# Patient Record
Sex: Male | Born: 1945 | Race: White | Smoking: Former smoker
Health system: Northeastern US, Academic
[De-identification: ages and names within clinical notes are randomized; demographics above are authoritative.]

## PROBLEM LIST (undated history)

## (undated) DIAGNOSIS — A6920 Lyme disease, unspecified: Secondary | ICD-10-CM

## (undated) DIAGNOSIS — R51 Headache: Secondary | ICD-10-CM

## (undated) HISTORY — DX: Headache: R51

## (undated) HISTORY — DX: Lyme disease, unspecified: A69.20

---

## 2008-05-30 DIAGNOSIS — I10 Essential (primary) hypertension: Secondary | ICD-10-CM | POA: Insufficient documentation

## 2008-05-30 DIAGNOSIS — E785 Hyperlipidemia, unspecified: Secondary | ICD-10-CM | POA: Insufficient documentation

## 2009-01-04 DIAGNOSIS — K6289 Other specified diseases of anus and rectum: Secondary | ICD-10-CM | POA: Insufficient documentation

## 2009-01-04 DIAGNOSIS — D126 Benign neoplasm of colon, unspecified: Secondary | ICD-10-CM | POA: Insufficient documentation

## 2010-07-30 ENCOUNTER — Encounter: Payer: Self-pay | Admitting: Gastroenterology

## 2010-08-21 ENCOUNTER — Ambulatory Visit
Admit: 2010-08-21 | Discharge: 2010-08-21 | Disposition: A | Discharge status: Home / Self Care | Payer: Self-pay | Source: Ambulatory Visit | Attending: Family Medicine | Admitting: Family Medicine

## 2010-08-21 LAB — ALT: ALT: 31 U/L (ref 0–50)

## 2010-08-21 LAB — AST: AST: 28 U/L (ref 0–50)

## 2010-08-21 LAB — CBC
Hematocrit: 46 % (ref 40–51)
Hemoglobin: 15.6 g/dL (ref 13.7–17.5)
MCV: 95 fL — ABNORMAL HIGH (ref 79–92)
Platelets: 191 THOU/uL (ref 150–330)
RBC: 4.9 MIL/uL (ref 4.6–6.1)
RDW: 13 % (ref 11.6–14.4)
WBC: 5.1 THOU/uL (ref 4.2–9.1)

## 2010-08-21 LAB — LIPID PANEL
Chol/HDL Ratio: 1.9
Cholesterol: 143 mg/dL
HDL: 74 mg/dL
LDL Calculated: 56 mg/dL
Non HDL Cholesterol: 69 mg/dL
Triglycerides: 65 mg/dL

## 2010-08-21 LAB — PSA (EFF.4-2010): PSA (eff. 4-2010): 0.74 ng/mL (ref 0.00–4.00)

## 2010-08-21 LAB — GLUCOSE: Glucose: 82 mg/dL (ref 74–106)

## 2010-08-21 LAB — TESTOSTERONE BY IMMUNOASSAY (ADULT MALES OR INDIVIDUALS ON TESTOSTERONE HORMONE THERAPY): Testosterone: 492 ng/dL (ref 193–740)

## 2010-09-20 ENCOUNTER — Ambulatory Visit: Payer: Self-pay | Admitting: Family Medicine

## 2010-09-21 NOTE — Progress Notes (Signed)
 Reason For Visit   Follow up  [facility] LPN.  HPI   Presents for evaluation, wishing to review status of his health.     His head difficulties in the recent past, erectile failure having,   frustration.     Having been married 3 times, first ending in divorce, his second wife dying   in a house fire, his most recent marriage also has ended in divorce,   patient now having a girlfriend for the past several months.  He has had   prior HIV testing, feeling that there is no significant risk of his having   troubles at this point.     He denies any difficulties with libido however acknowledges feelings of   avoidance due to fear of failure.     He has previously used Levitra, taking half of a 20 mg tablet which has   been helpful, 20 mg poorly tolerated with extreme rhinitis, having some   degree of headache with the 10 mg dose.  To some extent, this is   reminiscent of the headache that he was experiencing when concerns about   Lyme disease had been very active 10 years ago, severity of current   headache however much less (3-4/10 due to medication, 8/10 due to Lyme   problems).     He has had no other symptoms that would suggest that Lyme disease isn't an   active problem however his head is extremely sensitive, inadvertently   bumping his head be much more painful at this point than it was prior to   the Lyme diagnosis.     He does have doxycycline, using that at the earliest sign of infection,   finding that that has been helpful for him, having been on chronic   antibiotic therapy for a number of years in the past.     He has had recurrent difficulties with cold sores, but having been an area   of discussion with his girlfriend, but thus far has not become a major   concern.     He denies any chest pain, shortness of breath, neurologic symptoms,   difficulties with urinary tract, his last digital rectal exam having been   almost 2 years ago at the time of his physical in February 2010.  Allergies   No Known Drug  Allergy.  Current Meds   ** Medication reconciliation completed. **.  Doxycycline Monohydrate 100 MG Tablet;TAKE 1 TABLET EVERY 12 HOURS DAILY.;   Rx  Levitra 20 MG Tablet;TAKE AS DIRECTED.; Rx  Valacyclovir HCl 1 GM Tablet;TAKE 2 TABLETS TWICE DAILY FOR 1 DAY AT FIRST   SIGN OF ONSET.; Rx  Levitra 10 MG Tablet;TAKE AS DIRECTED.; Rx  Cetirizine HCl 10 MG Tablet;TAKE 1 TABLET DAILY AS NEEDED.; RPT.  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   Charna Elizabeth  Hyperlipidemia (272.4)  Hypertension (401.9).  Vital Signs   Recorded by [id number] on 20 Sep 2010 01:29 PM  BP:136/84,   Weight: 208 lb.  Physical Exam   No acute distress.  Blood pressure at the upper end of normal.  Weight appears stable.  Establishes excellent eye contact, no speech or thought disturbance, mood   appropriate.  Neck:supple,without adenopathy,mass,tenderness, or deformity; carotids   normal, without bruits,no thyromegaly.  Lungs, normal to percussion and auscultation, no wheeze, rales, or rhonchi,   no accessory muscle use.  Heart, regular, normal rate, without murmurs, gallops, or rubs.  Genitalia: penis normal,testicles without mass or tenderness,scrotum   without mass, redness, or increased warmth.  Rectal exam, no evidence of mass, fissure or hemorrhoid, prostate normal   size and texture.  Stool is brown, heme-negative with QC.  Lower Extremities, without clubbing, cyanosis or edema.  Peripheral pulses   intact with good capillary refill.     .  Assessment   Erectile dysfunction, patient having adverse effects related to his use of   Levitra, 20 mg causing severe rhinitis and headache, 10 mg still associated   with headache.     Discussion relative to HIV risk, patient feeling confident that she does   not need to do any screening albeit current relationship being unprotected.     Headache, appearing to be related to medication, having a significant   history of Lyme disease related  headache in the past.     Lyme disease, currently appearing to be in an active issue, presentation   issues as noted above.     Recurrent cold sores, patient requesting prescription for antiviral.  It is   not clear to him how frequently these are actually occurring at this time,   preferring to use as needed dosing rather than maintenance dosing at this   time.  Health Mgmt Plan   HIV Testing-accepted every 60 years; for HEALTH MAINTENANCE.  HIV Testing-accepted every 60 years; for HEALTH MAINTENANCE.  HIV Testing-declined every 10 years; for HEALTH MAINTENANCE.  Orders   Valacyclovir HCl 1 GM Tablet;TAKE 2 TABLETS TWICE DAILY FOR 1 DAY AT FIRST   SIGN OF ONSET; Qty8; R3; Rx.  Levitra 10 MG Tablet;TAKE AS DIRECTED; Qty6; R5; Rx.  Plan   Discuss ADRs is advised to Levitra, suggesting a 10 mg tablet, half tablet   daily may drop in the low headache threshold while supervising clinical   benefit.  If not, then will return to the 20 mg dose.     HIV risk issues are reviewed, patient declining testing at this point.     Reassure ALT of the headaches.  He potential that some of his headache   concerns may be remotely related to his prior history of Lyme disease,   having had severe headaches for a number of years, prior physician having   suggested meningeal involvement as a source.  If this were the case, it is   quite likely that there is some mild residual scarring making his   sensitivity to contusion much greater.  The headache associated with   Levitra however sounds much different and is likely vascular rather than   mechanical.     Suggest use of valacyclovir, 2 g twice daily for day at the onset of cold   sore.  He is reminded of the potential for viral shedding, suggesting that   if there is any significant frequency of cold sores, not only can these   flares be prevented but also a significant reduction in viral shedding   while asymptomatic can be accomplished.     Advised that he plan on seeing me in a year  for a physical exam, reassuring   him that his current exam including digital rectal appears to be benign.    He had a tubular adenoma at the time of his colonoscopy in April 2010, he   should have followup study in 3-5 years, seeking advice from Dr. Odella Aquas   being advised     30 minute face-to-face encounter with patient, greater than 50% of which  is   spent counseling and educating relative to management issues.  Signature   Electronically signed by: Remonia Richter  M.D.; 09/21/2010 1:17 PM EST.

## 2010-10-14 NOTE — Miscellaneous (Unsigned)
 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 ; RPT   Doxycycline Monohydrate 100 MG Tablet; TAKE 1 TABLET EVERY 12 HOURS DAILY.   ; Rx   Levitra 10 MG Tablet; TAKE AS DIRECTED. ; Rx   Valacyclovir HCl 1 GM Tablet; TAKE 2 TABLETS TWICE DAILY FOR 1 DAY AT FIRST   SIGN OF ONSET. ; Rx   Valacyclovir HCl 500 MG Tablet; TAKE 1 TABLET DAILY. ; Rx   Viagra 50 MG Tablet; 1/2 - 2 tabs as directed ; Rx     Immunizations  Tdap (Adacel)

## 2011-04-02 ENCOUNTER — Encounter: Payer: Self-pay | Admitting: Family Medicine

## 2011-04-02 ENCOUNTER — Telehealth: Payer: Self-pay | Admitting: Family Medicine

## 2011-04-02 ENCOUNTER — Ambulatory Visit: Payer: Self-pay | Admitting: Family Medicine

## 2011-04-02 VITALS — BP 138/82 | HR 59 | Temp 98.3°F | Resp 17 | Wt 215.0 lb

## 2011-04-02 DIAGNOSIS — R3129 Other microscopic hematuria: Secondary | ICD-10-CM

## 2011-04-02 DIAGNOSIS — M549 Dorsalgia, unspecified: Secondary | ICD-10-CM

## 2011-04-02 LAB — POCT URINALYSIS DIPSTICK
Glucose,UA: NORMAL
Ketones, UA: NEGATIVE
Leuk Esterase,UA: NEGATIVE
Lot #: 21146903
Nitrite,UA POCT: NEGATIVE
PH,Ur: 5

## 2011-04-02 NOTE — Progress Notes (Signed)
SUBJECTIVE:Wishing expressing concerns about back pain.    States that he had blood work done 6 weeks ago from the [place], having not heard anything of concern however at that time did have some lower back pain, the provider that he saw telling him that it was probably from his kidneys but pursued no further.  Over the past 6 weeks, this has become persistent, day and night, recently becoming more intense.    Denies any injury.  Denies any hematuria, frequency, urgency.  Denies any cough, dyspnea.  Does have significant smoking history as noted above.  He had URI symptoms 2-3 weeks ago, transient chest cold but those seem to have resolved, pain unaffected by illness and clearly not resolving as listed.    Pain increases with twisting, bending, or putting pressure on his back.  Denies any radicular symptoms.  Appetite has been good however he has become progressively worried about etiology of this pain, not feeling that persistent unexplained pain may be related to cancer.     OBJECTIVE:No acute distress.  Interacts well.  Apprehension evident, concerns as noted above.  Blood pressure excellent.  Afebrile.  O2 saturation excellent.  Respiratory rate is not increased.  He has marked left CVA tenderness, palpation or percussion.  Pain is aggravated by lateral bending or rotation to the left, lateral bending to the right generally well-tolerated.  He can bend over nearly to touch toes, discomfort at extremes of flexion or upon completely achieving a full standing position.    Urinalysis as noted, microscopic protein and hematuria     ASSESSMENT:Left back pain, certainly the most concerning issue would be kidney stone with progressive hydronephrosis.  In the absence of symptoms associated with voiding, genitourinary etiology however may not be present.     PLAN:Arrange for urgent ultrasound of kidneys.  Will make additional plans based on results.    ADDENDUM:  Ultrasound demonstrates no evidence of hydronephrosis, further  showing normal bladder, no evidence of kidney stones, additionally noting That his aorta appears to be quite healthy, lower bifurcation not well seen however with pain virtually subcostal, distal arterial disease would be an unlikely source of symptoms.  This is reviewed with patient.  Advise that we check chest x-ray, additionally checking blood work, comprehensive profile, amylase, lipase, CBC with differential, and sedimentation rate.  Patient will get blood work and x-ray done tomorrow morning.  The above studies added to today's encounter orders.

## 2011-04-02 NOTE — Telephone Encounter (Signed)
RENAL ULTRA SOUND IS NORMAL

## 2011-04-03 ENCOUNTER — Ambulatory Visit
Admit: 2011-04-03 | Discharge: 2011-04-03 | Disposition: A | Discharge status: Home / Self Care | Payer: Self-pay | Source: Ambulatory Visit | Attending: Family Medicine | Admitting: Family Medicine

## 2011-04-03 DIAGNOSIS — M549 Dorsalgia, unspecified: Secondary | ICD-10-CM

## 2011-04-03 DIAGNOSIS — R3129 Other microscopic hematuria: Secondary | ICD-10-CM

## 2011-04-03 LAB — COMPREHENSIVE METABOLIC PANEL
ALT: 44 U/L (ref 0–50)
AST: 28 U/L (ref 0–50)
Albumin: 4.5 g/dL (ref 3.5–5.2)
Alk Phos: 71 U/L (ref 40–130)
Anion Gap: 11 (ref 7–16)
Bilirubin,Total: 0.7 mg/dL (ref 0.0–1.2)
CO2: 28 mmol/L (ref 20–28)
Calcium: 8.7 mg/dL (ref 8.6–10.2)
Chloride: 103 mmol/L (ref 96–108)
Creatinine: 1.02 mg/dL (ref 0.67–1.17)
GFR,Black: >59 *
GFR,Caucasian: >59 *
Glucose: 77 mg/dL (ref 60–99)
Lab: 13 mg/dL (ref 6–20)
Potassium: 4 mmol/L (ref 3.3–5.1)
Sodium: 142 mmol/L (ref 133–145)
Total Protein: 6.8 g/dL (ref 6.3–7.7)

## 2011-04-03 LAB — AMYLASE: Amylase: 80 U/L (ref 28–100)

## 2011-04-03 LAB — CBC AND DIFFERENTIAL
Baso # K/uL: 0 10*3/uL (ref 0.0–0.1)
Basophil %: 0.5 % (ref 0.2–1.2)
Eos # K/uL: 0.2 10*3/uL (ref 0.0–0.5)
Eosinophil %: 3 % (ref 0.8–7.0)
Hematocrit: 47 % (ref 40–51)
Hemoglobin: 15.8 g/dL (ref 13.7–17.5)
Lymph # K/uL: 1.9 10*3/uL (ref 1.3–3.6)
Lymphocyte %: 30.1 % (ref 21.8–53.1)
MCV: 97 fL — ABNORMAL HIGH (ref 79–92)
Mono # K/uL: 0.6 10*3/uL (ref 0.3–0.8)
Monocyte %: 9.8 % (ref 5.3–12.2)
Neut # K/uL: 3.7 10*3/uL (ref 1.8–5.4)
Platelets: 181 10*3/uL (ref 150–330)
RBC: 4.9 MIL/uL (ref 4.6–6.1)
RDW: 13.2 % (ref 11.6–14.4)
Seg Neut %: 56.6 % (ref 34.0–67.9)
WBC: 6.4 10*3/uL (ref 4.2–9.1)

## 2011-04-03 LAB — SEDIMENTATION RATE, AUTOMATED: Sedimentation Rate: 5 mm/hr (ref 0–20)

## 2011-04-03 LAB — LIPASE: Lipase: 38 U/L (ref 13–60)

## 2011-04-04 NOTE — Telephone Encounter (Signed)
Darius Anderson advised of the negative ultrasound, chest x-ray and blood work ordered, that also looking totally normal.  I suspect that the left flank/back pain is muscular.  Prior to doing any additional inquiries, a trial of physical therapy is reasonable.  The amount of blood evident in his urine in the context of his ultrasound is probably insignificant in retrospect.

## 2011-04-10 ENCOUNTER — Other Ambulatory Visit: Payer: Self-pay | Admitting: Family Medicine

## 2011-04-10 DIAGNOSIS — M549 Dorsalgia, unspecified: Secondary | ICD-10-CM

## 2011-04-11 NOTE — Telephone Encounter (Signed)
Pt aware   Will try some PT  Rx sent to [place]

## 2011-04-14 MED ORDER — NON-SYSTEM MEDICATION *A*
Status: AC
Start: 2011-04-10 — End: ?

## 2011-04-16 ENCOUNTER — Encounter: Payer: Self-pay | Admitting: Gastroenterology

## 2011-04-18 ENCOUNTER — Encounter: Payer: Self-pay | Admitting: Gastroenterology

## 2011-05-19 ENCOUNTER — Encounter: Payer: Self-pay | Admitting: Gastroenterology

## 2011-08-26 ENCOUNTER — Encounter: Payer: Self-pay | Admitting: Family Medicine

## 2011-08-26 ENCOUNTER — Ambulatory Visit: Payer: Self-pay | Admitting: Family Medicine

## 2011-08-26 VITALS — BP 118/72 | HR 58 | Temp 98.4°F | Resp 18 | Ht 71.0 in | Wt 208.6 lb

## 2011-08-26 DIAGNOSIS — B009 Herpesviral infection, unspecified: Secondary | ICD-10-CM

## 2011-08-26 DIAGNOSIS — N529 Male erectile dysfunction, unspecified: Secondary | ICD-10-CM

## 2011-08-26 DIAGNOSIS — I10 Essential (primary) hypertension: Secondary | ICD-10-CM

## 2011-08-26 DIAGNOSIS — L299 Pruritus, unspecified: Secondary | ICD-10-CM

## 2011-08-26 MED ORDER — SILDENAFIL CITRATE 100 MG PO TABS *I*
100.0000 mg | ORAL_TABLET | Freq: Every day | ORAL | Status: AC | PRN
Start: 2011-08-26 — End: 2012-02-22

## 2011-08-26 MED ORDER — DOXYCYCLINE MONOHYDRATE 100 MG PO TABS *I*
ORAL_TABLET | ORAL | Status: AC
Start: 2011-08-26 — End: ?

## 2011-08-26 MED ORDER — TADALAFIL 20 MG PO TABS *I*
20.0000 mg | ORAL_TABLET | Freq: Every day | ORAL | Status: AC | PRN
Start: 2011-08-26 — End: 2012-02-22

## 2011-08-26 MED ORDER — VALACYCLOVIR HCL 1000 MG PO TABS *I*
ORAL_TABLET | ORAL | Status: AC
Start: 2011-08-26 — End: ?

## 2011-08-26 NOTE — Progress Notes (Signed)
SUBJECTIVE: presents for aeration, being scheduled for a physical exam.  He states that he is losing his health insurance as a [employer], this to end at the end of this year.  As a result, he will be losing not only covered for office visits but also for medications.  As a result, he is planning to go to the [place] to get his health care, planning to do so for the next 2 years, at that time having Medicare.  He has a number of concerns that he would like to review, choosing not to have a physical at today's visit as she will be having a physical within the next day or 2 at the [place].    Concerns about diffuse body itching are expressed, not being clear what is causing this.  He does continue with valacyclovir on a regular basis, also taking doxycycline only on an as-needed basis for pimples on his nose and scalp, having never had these difficulties prior to his diagnosis of Lyme disease in the distant past.  He has been on both of these  for quite sometime, clearly doxycycline actually being used infrequently.    His use of Valtrex, 2 twice daily for 2 days, starting at the earliest onset of any symptoms.  Usually this is very helpful, having had 3 episodes last month.  Thus far has had none.  He wonders about pursuing maintenance therapy, less inclined to pursue this given the good month that he has had.    He also complains of difficulties with sexual performance, this being somewhat frustrating as his partner is somewhat younger than he, his intellectual decided therefore significant.  He complains not only of difficulties with erectile function, having had some success with use of Viagra, having difficulties with libido as well.    He does continue to use dietary management for control of his blood pressure, keeping weight down as well as his dietary salt intake low    OBJECTIVE:no acute distress.  Interacts well.  Blood pressure excellent.  Height and weight noted, patient appearing quite fit.  Neck:supple,without  adenopathy,mass,tenderness, or deformity; carotids normal, without bruits,no thyromegaly.  Lungs, normal to percussion and auscultation, no wheeze, rales, or rhonchi, no accessory muscle use.  Heart, regular, normal rate, without murmurs, gallops, or rubs.  Lower Extremities, without clubbing, cyanosis or edema.  Peripheral pulses intact with good capillary refill.  No evidence of any rash, this in spite of his diffuse itching.  No active herpetic lesions evident.  Laboratory values as noted below.     ASSESSMENT: diffuse pruritic, etiology not entirely clear.  Trying to find a reasonable antihistamine regimen that will be well tolerated seems most appropriate.  None of his current medications but seemed to be a likely etiology as nothing that he is using is daily, although his itch is persistent.    Erectile dysfunction with diminished libido, patient feeling frustrated.  He feels that it may be worthwhile trying short or long acting erectile agent, additionally considering a somewhat higher dose of Viagra, currently using 25 mg but not having tried 50.    Hypertension, diet-controlled.    Recurrent herpes, consideration of maintenance valacyclovir reviewed    PLAN:suggest that he try using cetirizine, 10 mg on a daily basis, concurrently using ranitidine if need be provided H2 blockade.  Prescriptions provided for not only 100 mg Viagra but also 20 mg Cialis.  Use of each of these agents is reviewed.  Continue with diet management as requested blood  pressure.  Refill valacyclovir.  I would have a low threshold for trying maintenance.  Continue with his current approach with doxycycline, using this only as needed.  He should followup with me as needed.  Once his current insurance is no longer viable, he will need to pay fee for service to see me and therefore receive the vast majority of his care through the [place].        Outpatient Encounter Prescriptions as of 08/26/2011   Medication Sig Dispense Refill   .  valacyclovir (VALTREX) 1 GM tablet TAKE 2 TABLETS TWICE DAILY FOR 1 DAY AT FIRST SIGN OF ONSET.  8  3   . cetirizine (ZYRTEC) 10 MG tablet TAKE 1 TABLET DAILY AS NEEDED.    0   . sildenafil (VIAGRA) 50 MG tablet 1/2 - 2 tabs as directed  6  1   . doxycycline (ADOXA) 100 MG tablet TAKE 1 TABLET EVERY 12 HOURS DAILY.  60  11   . Non-System Medication PT Evaluate & Treat  Left side back pain    10 each  2   .         . DISCONTD: vardenafil (LEVITRA) 10 MG tablet TAKE AS DIRECTED.  6  5     Results for JARETTE, PULLUM (MRN [id number]) as of 08/26/2011 09:25   Ref. Range 04/03/2011 15:07   Sodium Latest Range: 133-145 mmol/L 142   Potassium Latest Range: 3.3-5.1 mmol/L 4.0   Chloride Latest Range: 96-108 mmol/L 103   CO2 Latest Range: 20-28 mmol/L 28   Anion Gap Latest Range: 7-16  11   UN Latest Range: 6-20 mg/dL 13   Creatinine Latest Range: 0.67-1.17 mg/dL 0.45   GFR,Black No range found > 59   GFR,Caucasian No range found > 59   Glucose Latest Range: 60-99 mg/dL 77   Calcium Latest Range: 8.6-10.2 mg/dL 8.7   Total Protein Latest Range: 6.3-7.7 g/dL 6.8   Albumin Latest Range: 3.5-5.2 g/dL 4.5   ALT Latest Range: 0-50 U/L 44   AST Latest Range: 0-50 U/L 28   Alk Phos Latest Range: 40-130 U/L 71   Amylase Latest Range: 28-100 U/L 80   Bilirubin,Total Latest Range: 0.0-1.2 mg/dL 0.7   Lipase Latest Range: 13-60 U/L 38   WBC Latest Range: 4.2-9.1 THOU/uL 6.4   RBC Latest Range: 4.6-6.1 MIL/uL 4.9   Hemoglobin Latest Range: 13.7-17.5 g/dL 40.9   Hematocrit Latest Range: 40-51 % 47   MCV Latest Range: 79-92 fL 97 (H)   RDW Latest Range: 11.6-14.4 % 13.2   Platelets Latest Range: 150-330 THOU/uL 181   Neut # K/uL Latest Range: 1.8-5.4 THOU/uL 3.7   Lymph # K/uL Latest Range: 1.3-3.6 THOU/uL 1.9   Mono # K/uL Latest Range: 0.3-0.8 THOU/uL 0.6   Eos # K/uL Latest Range: 0.0-0.5 THOU/uL 0.2   Baso # K/uL Latest Range: 0.0-0.1 THOU/uL 0.0   Seg Neut % Latest Range: 34.0-67.9 % 56.6   Lymphocyte % Latest Range: 21.8-53.1 % 30.1    Monocyte % Latest Range: 5.3-12.2 % 9.8   Eosinophil % Latest Range: 0.8-7.0 % 3.0   Basophil % Latest Range: 0.2-1.2 % 0.5   Sedimentation Rate Latest Range: 0-20 mm/hr 5

## 2013-12-27 ENCOUNTER — Telehealth: Payer: Self-pay

## 2013-12-27 LAB — PCMH FALL RISK PLAN

## 2013-12-27 LAB — PCMH FALL RISK ASSESSMENT

## 2013-12-27 NOTE — Telephone Encounter (Signed)
Fall risk assessment completed, declined information

## 2018-12-30 IMAGING — CT CT CHEST WITH CONTRAST
2 of 3 series · 15 of 36 positions shown, 18 images · IV contrast (APPLIED)
Comparison: There are no previous exams available for comparison.

CT CHEST WITH CONTRAST, 12/30/2018 [DATE]: 
CLINICAL INDICATION: Chest pain for 2 to 3 months per 
A search for DICOM formatted images was conducted for prior CT imaging studies 
completed at a non-affiliated media free facility.
TECHNIQUE: The chest was scanned from base of neck through the lung bases with 
200cc of Isovue 300 injected intravenously on a high resolution low dose CT 
scanner.  Routine MPR and MIP 3D renderings were reconstructed on an independent 
workstation with concurrent physician supervision.

[Series 4: pe chest 2.0 i31s 3 · axial · 0.97mm/px · z∈[-304,-74]mm · 12 of 136 slices shown, 15 images]
[im 11/136  mediastinal]
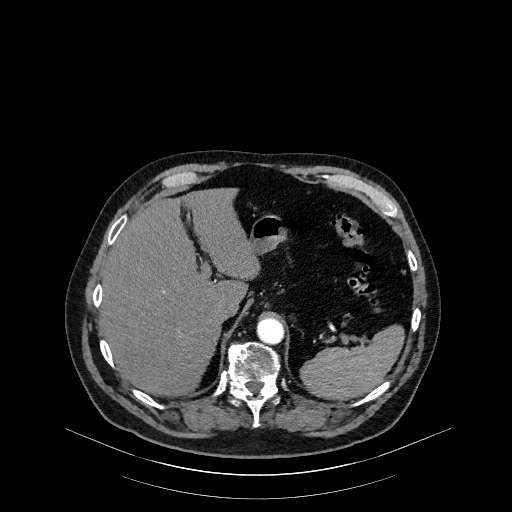
[im 11/136  lung]
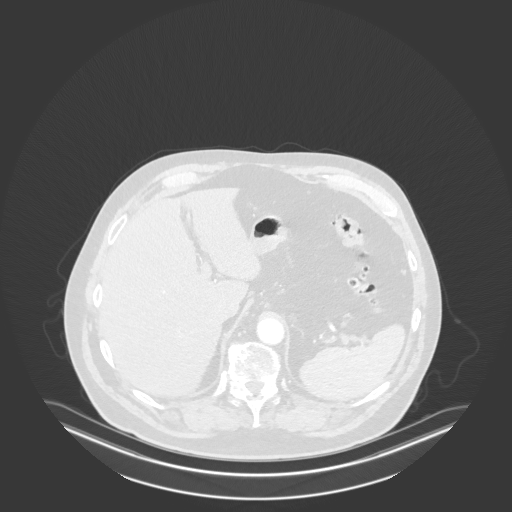
[im 21/136  lung]
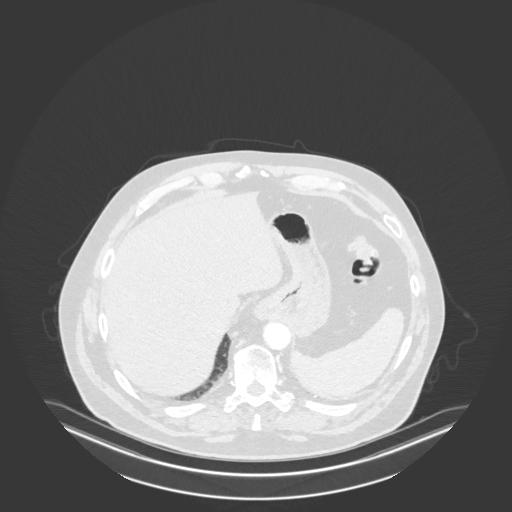
[im 31/136  lung]
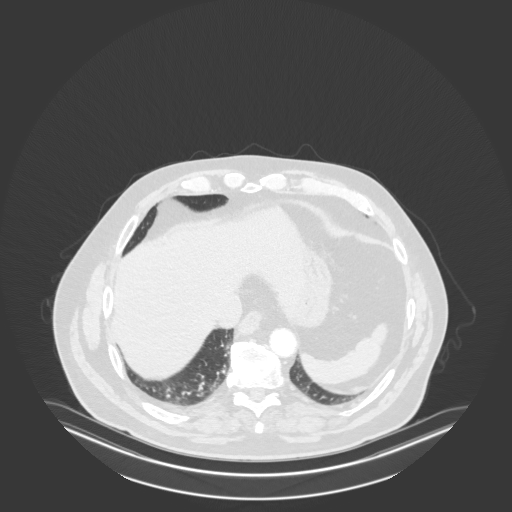
[im 41/136  lung]
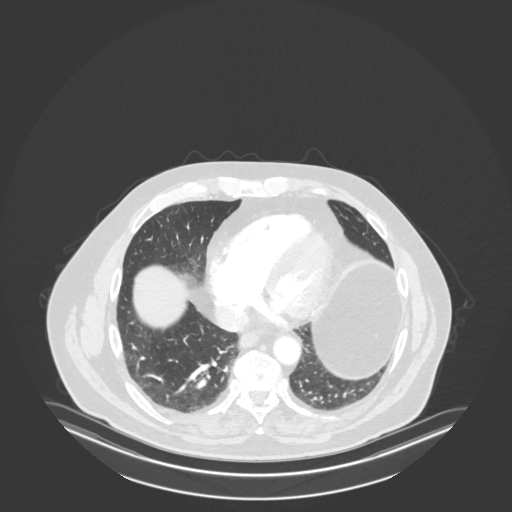
[im 51/136  mediastinal]
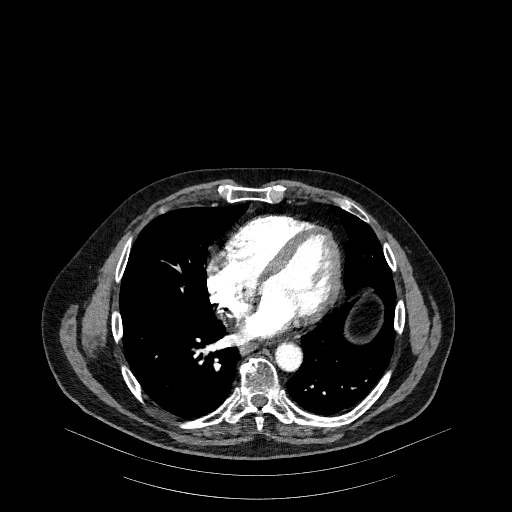
[im 51/136  lung]
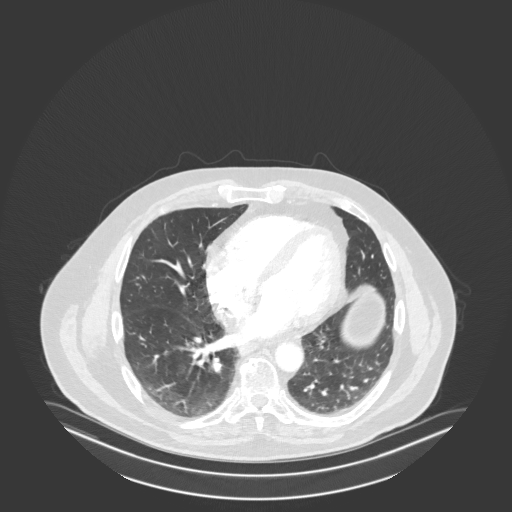
[im 61/136  lung]
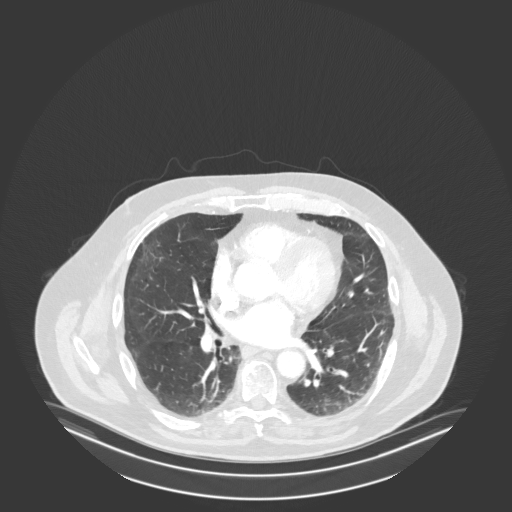
[im 76/136  lung]
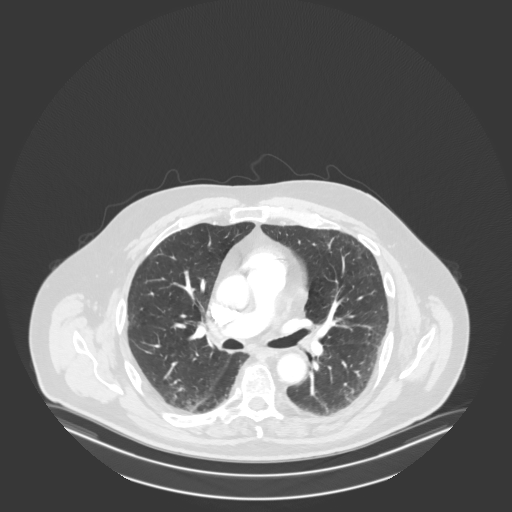
[im 86/136  lung]
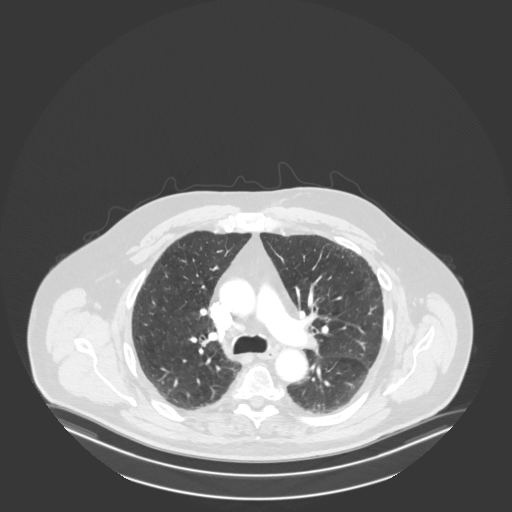
[im 96/136  mediastinal]
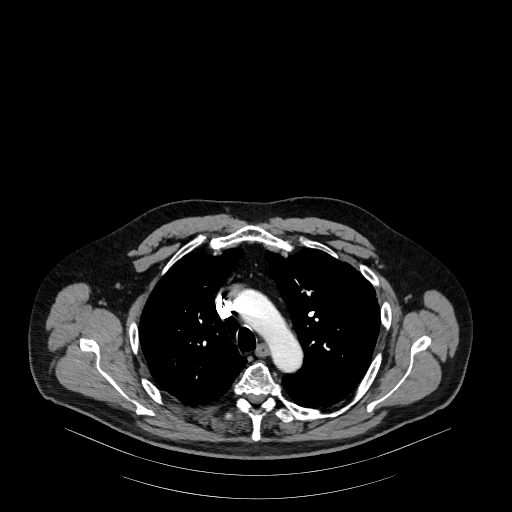
[im 96/136  lung]
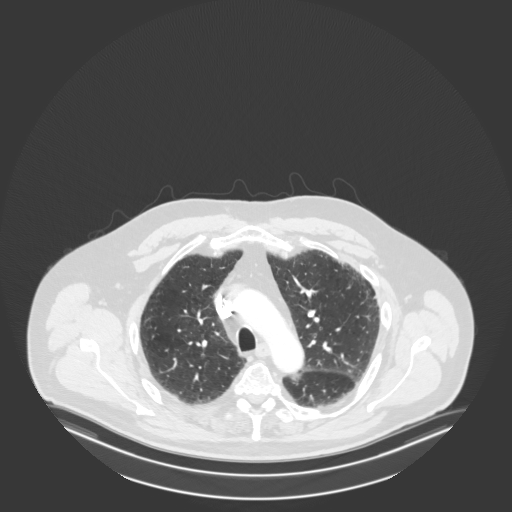
[im 106/136  lung]
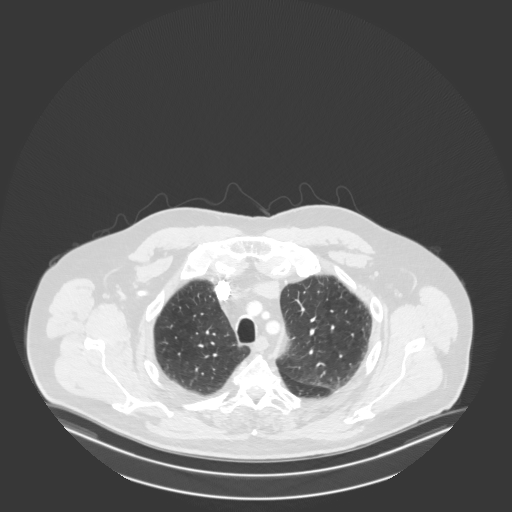
[im 116/136  lung]
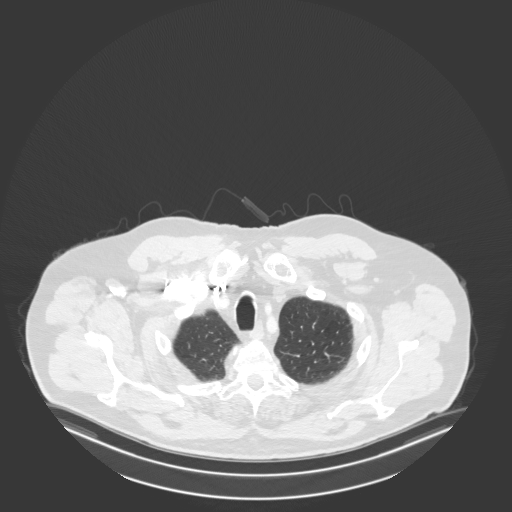
[im 126/136  lung]
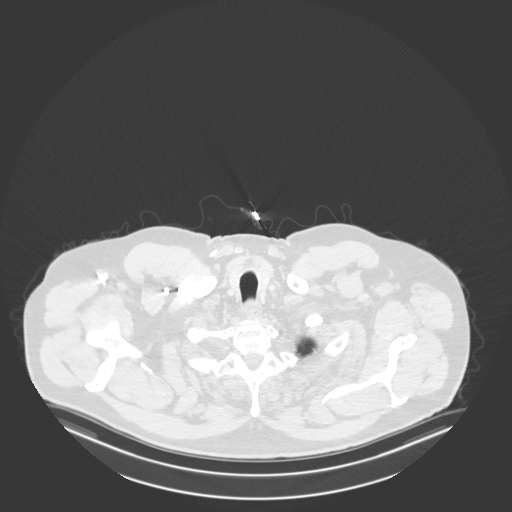

[Series 6: coronal · coronal · 0.55mm/px · 3 of 140 slices shown]
[im 28/140  lung]
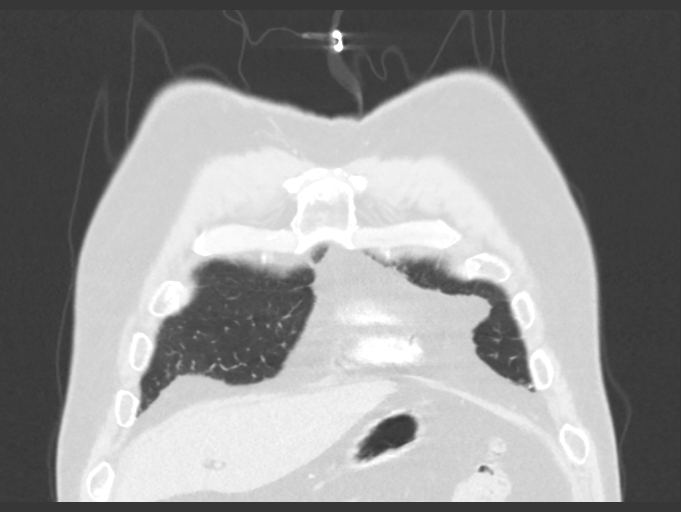
[im 56/140  lung]
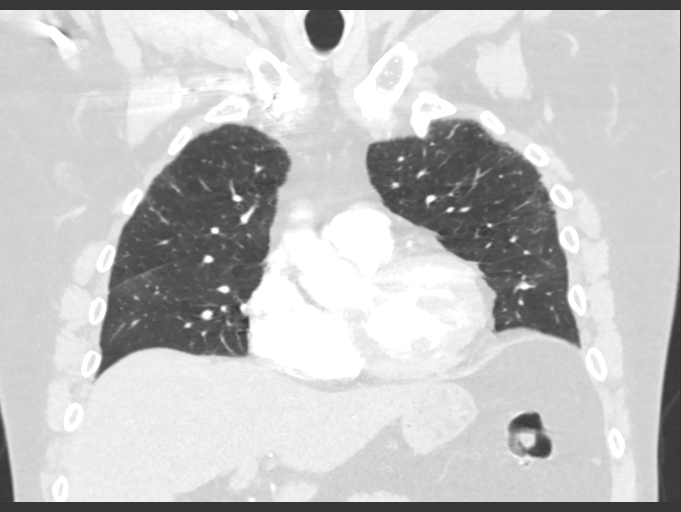
[im 84/140  lung]
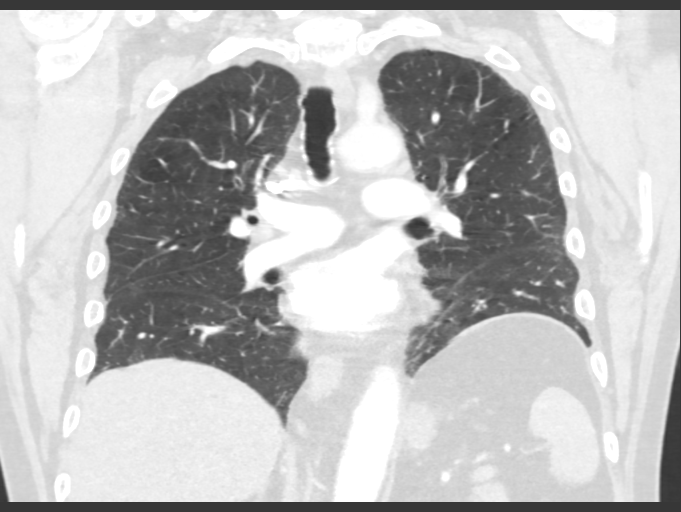

[15 of 36 positions shown; findings below may reference images not displayed]

FINDINGS: There is minor scarring at the lung bases with no mass or 
consolidations in the lungs and no pulmonary nodule seen. No mediastinal, no 
axillary and no hilar adenopathy. The heart size normal. There are moderate 
coronary calcifications. No lytic or blastic lesions are seen in the chest. 
Limited CT of the upper abdomen demonstrates normal visualized portions of the 
liver and spleen. There are gallstones in the gallbladder.
IMPRESSION: Minor scarring at the lung bases and periphery of lungs which could indicate 
early interstitial pulmonary fibrosis. No consolidations or mass seen on CT of 
the chest and no pulmonary embolus is seen. 
Gallstones in gallbladder without inflammatory changes. 
RADIATION DOSE REDUCTION: All CT scans are performed using radiation dose 
reduction techniques, when applicable.  Technical factors are evaluated and 
adjusted to ensure appropriate moderation of exposure.  Automated dose 
management technology is applied to adjust the radiation doses to minimize 
exposure while achieving diagnostic quality images.

## 2022-12-17 IMAGING — MR MRI CERVICAL SPINE WITHOUT CONTRAST
5 of 7 series · 21 of 48 positions shown · IV contrast (gadolinium)
Comparison: None

________________________________________________________________________________________________ 
MRI CERVICAL SPINE WITHOUT CONTRAST, 12/17/2022 [DATE]: 
CLINICAL INDICATION: Cervical stenosis, chronic neck pain extending to both arms
TECHNIQUE: Sagittal T1, Sagittal T2, Sagittal STIR, Axial TSE and Axial CPMMN 
images of the cervical spine were performed without intravenous gadolinium 
enhancement.

[Series 101: survey · axial · 10.0mm · 1.25mm/px · z∈[-6,+200]mm · 2 of 10 slices shown]
[im 1/10]
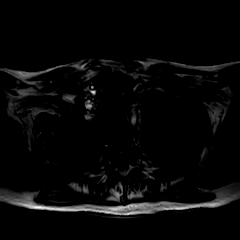
[im 10/10]
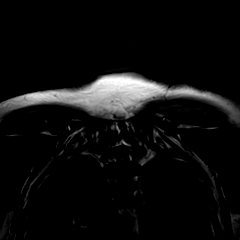

[Series 201: t2w_cor-surv · coronal · 5.0mm · 0.69mm/px · 2 of 7 slices shown]
[im 1/7]
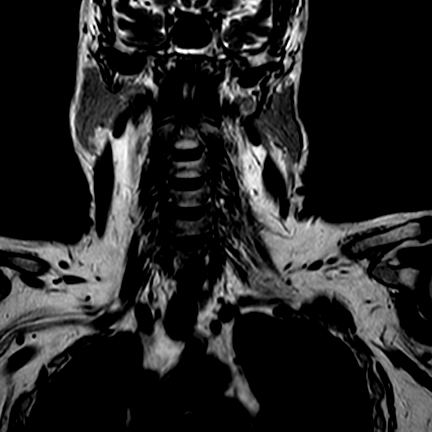
[im 7/7]
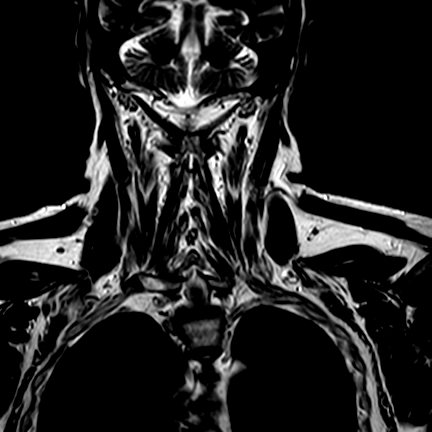

[Series 301: T1 · sagittal · 3.0mm · 0.39mm/px · 6 of 17 slices shown]
[im 1/17]
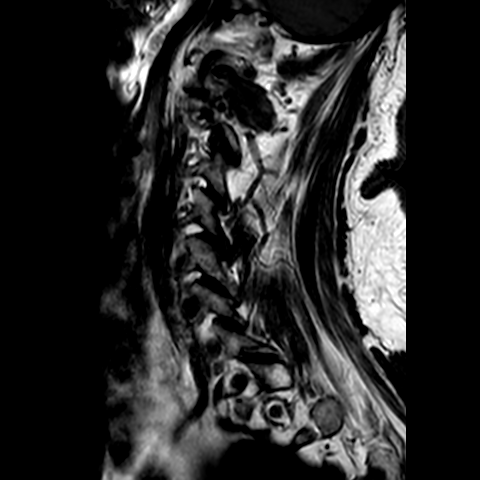
[im 4/17]
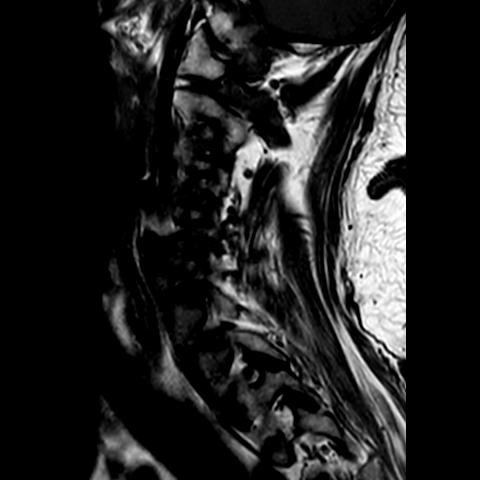
[im 7/17]
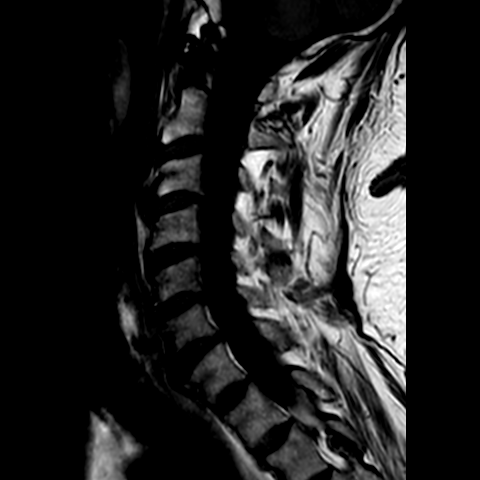
[im 10/17]
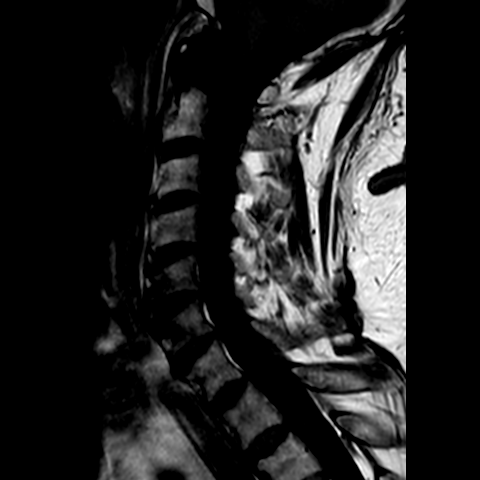
[im 13/17]
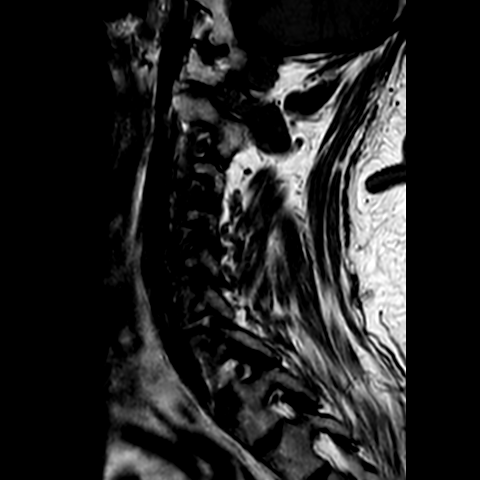
[im 17/17]
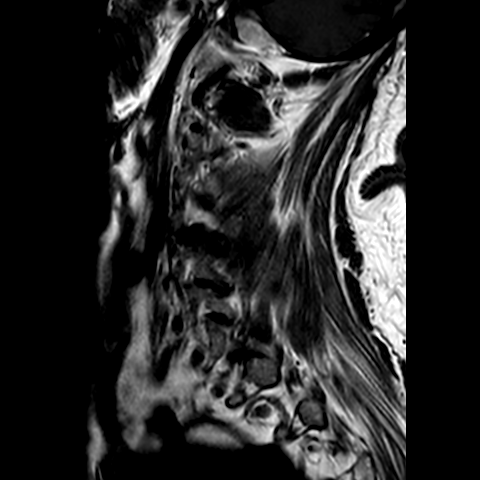

[Series 402: (id)_mdixon_tse · sagittal · 3.0mm · 0.35mm/px · 2 of 17 slices shown]
[im 1/17]
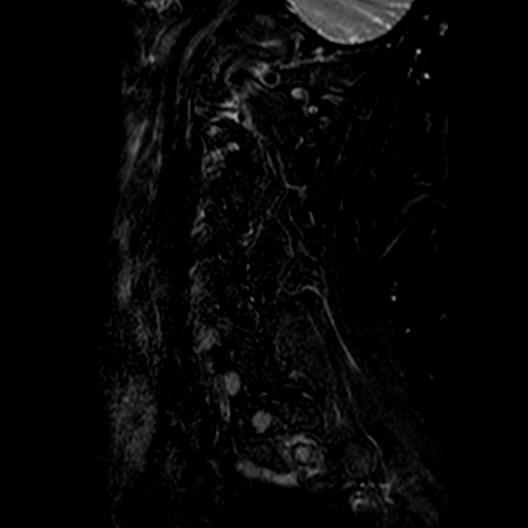
[im 4/17]
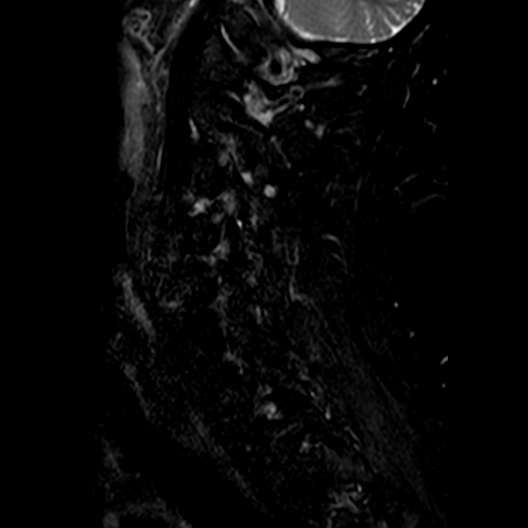

[Series 601: T2 · axial · 3.0mm · 0.31mm/px · z∈[-30,+80]mm · 9 of 38 slices shown]
[im 1/38]
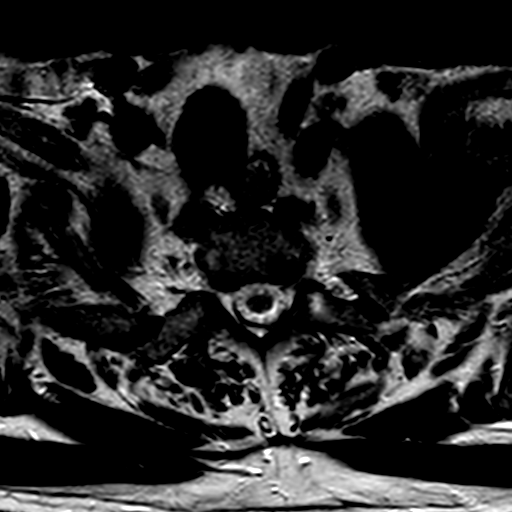
[im 7/38]
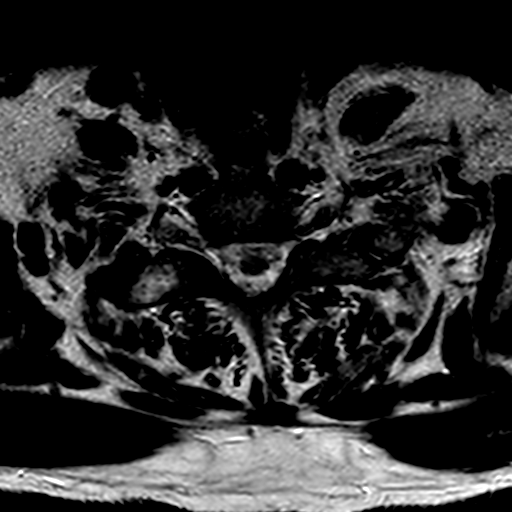
[im 13/38]
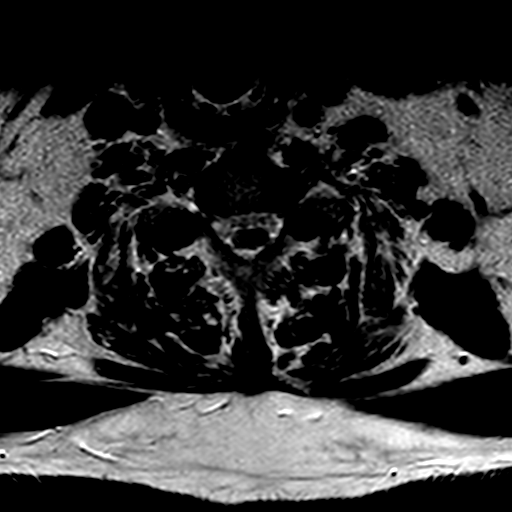
[im 16/38]
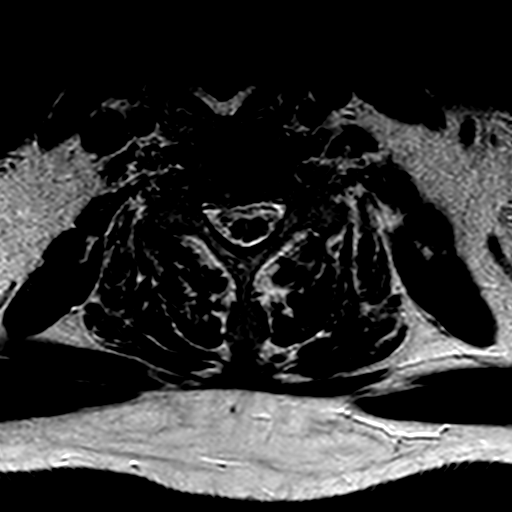
[im 19/38]
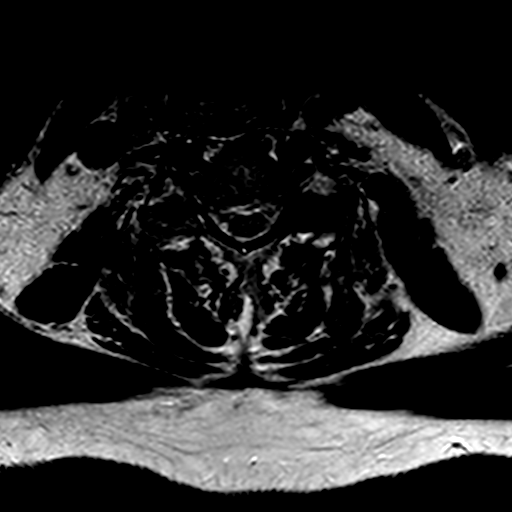
[im 22/38]
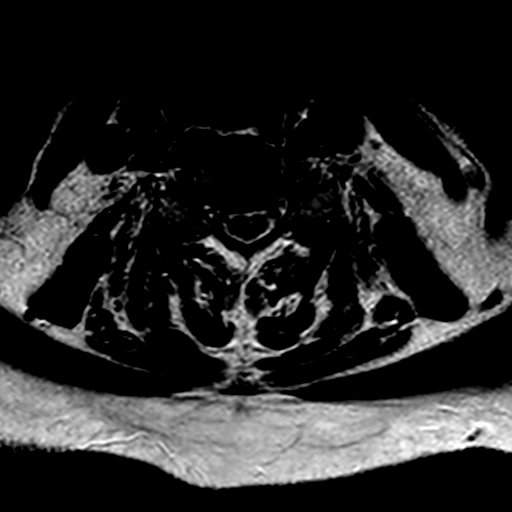
[im 25/38]
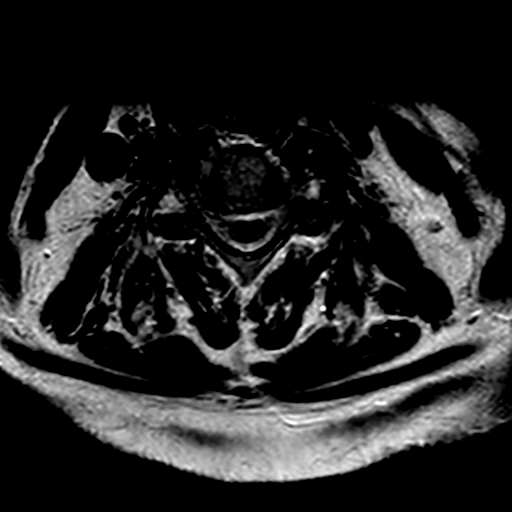
[im 31/38]
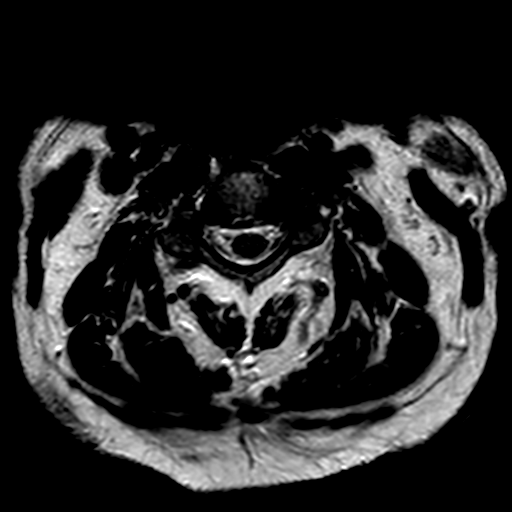
[im 38/38]
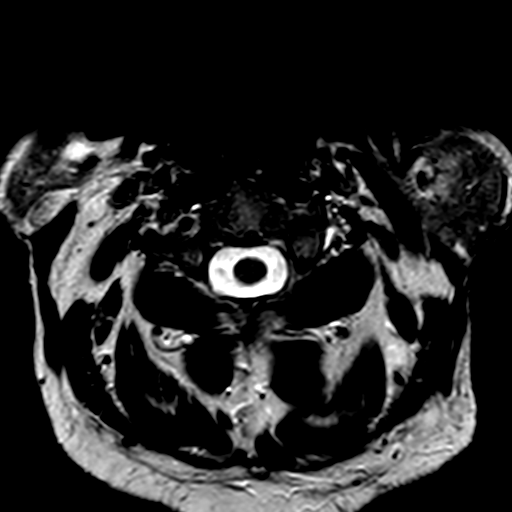

[21 of 48 positions shown; findings below may reference images not displayed]

FINDINGS: Cervical vertebral heights are intact. There is no significant loss 
of cervical disc height. There is spondylosis superimposed on moderately short 
posterior elements in the upper cervical spine. 
The dens is intact. There are atlantooccipital and atlantoaxial degenerative 
changes. Craniocervical junction is open. Cord signal appears normal. No 
evidence for malignancy. Mild Modic type I changes at C6-7. The zygapophyseal 
facet degenerative changes. 
At C2-3 there is a midline disc bulge with small protrusion, slightly effacing 
the ventral cord. Canal diameter is 9 mm, mildly stenotic. Mild right foraminal 
narrowing, left foramen open. 
At C3-4 there is mild disc bulge approximating the cord without deformity. Canal 
diameter is 9 mm. There is mild left foraminal stenosis, minimal on the right. 
At C4-5 there is mild broad-based disc bulge approximating the cord. Canal 
diameter 8.5 mm. There is moderate to marked bilateral foraminal stenosis, axial 
image 23. 
At C5-6 there is minimal disc bulge not touching the cord. Canal diameter 11 mm. 
There is marked left, moderate to marked right foraminal stenosis, axial image 
18. 
At C6-7 the canal is open. There is moderate to marked left, moderate right 
foraminal stenosis, axial image 13. 
At C7-T1 the canal and foramina are open.
IMPRESSION: Spondylosis. Slight effacement of the ventral cord at C2-3 by disc bulge with 
small midline protrusion. There is mild canal stenosis at C2-3, C3-4 and C4-5. 
Foraminal stenosis appears most pronounced at C4-5, C5-6 and C6-7 as described. 
There appears to be mild upper thoracic levoscoliosis. Cervical lordosis is 
preserved.

## 2022-12-17 IMAGING — MR MRI LUMBAR SPINE WITHOUT CONTRAST
7 of 9 series · 17 of 48 positions shown · IV contrast (gadolinium)
Comparison: None

________________________________________________________________________________________________ 
MRI LUMBAR SPINE WITHOUT CONTRAST, 12/17/2022 [DATE]: 
CLINICAL INDICATION: Evaluate for lumbar stenosis, chronic low back pain, 
radiculopathy
TECHNIQUE: Sagittal T1, Sagittal T2, Sagittal STIR, Axial T1 and Axial T2 MR 
images of the lumbar spine were performed without intravenous gadolinium 
enhancement.

[Series 101: survey · axial · 10.0mm · 1.25mm/px · 1 of 10 slices shown]
[im 1/10]
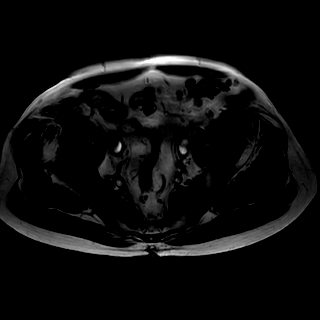

[Series 201: t2w_cor-surv · coronal · 6.0mm · 0.62mm/px · 2 of 14 slices shown]
[im 1/14]
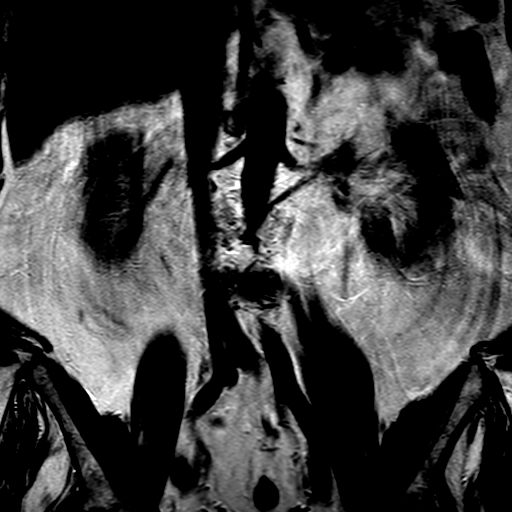
[im 14/14]
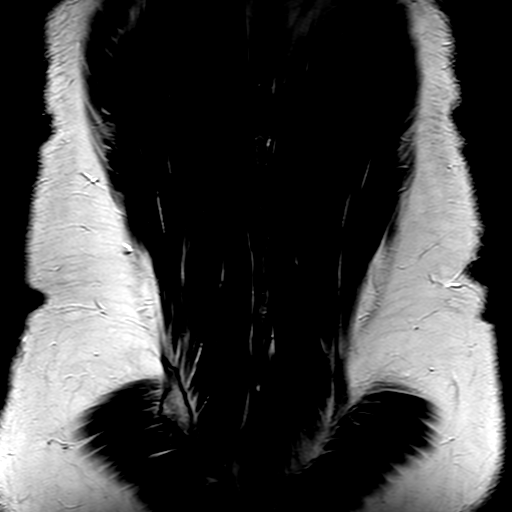

[Series 301: T1 · sagittal · 4.0mm · 0.46mm/px · 2 of 19 slices shown (1 of 2)]
[im 1/19]
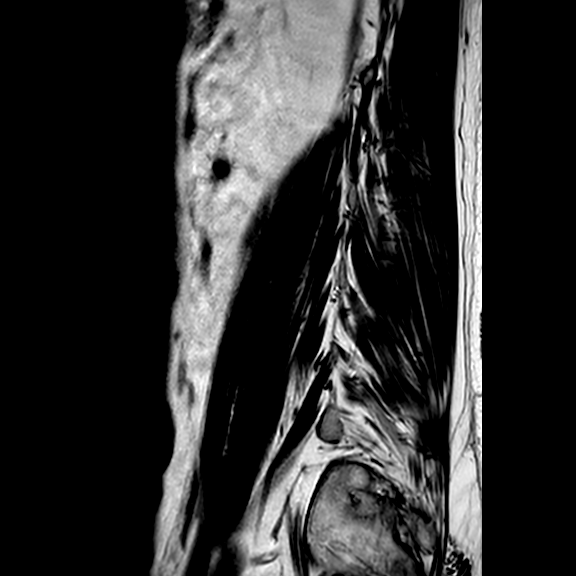
[im 19/19]
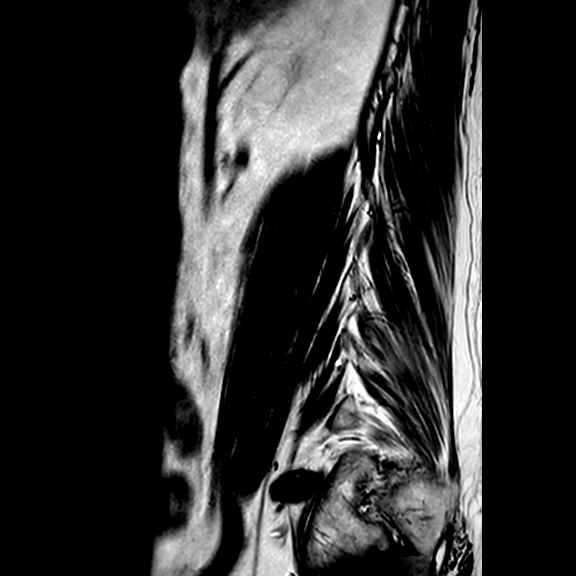

[Series 402: (id)_mdixon_tse · sagittal · 4.0mm · 0.38mm/px · 2 of 19 slices shown]
[im 1/19]
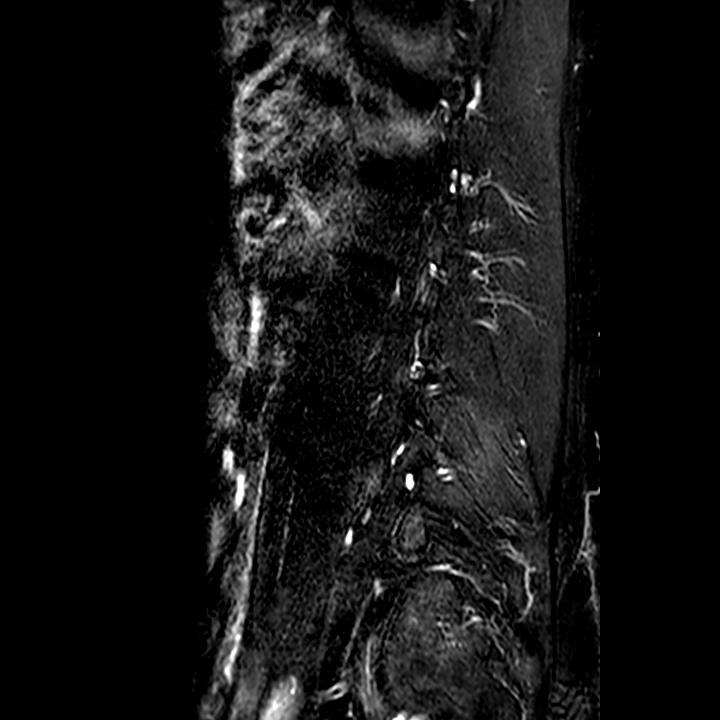
[im 19/19]
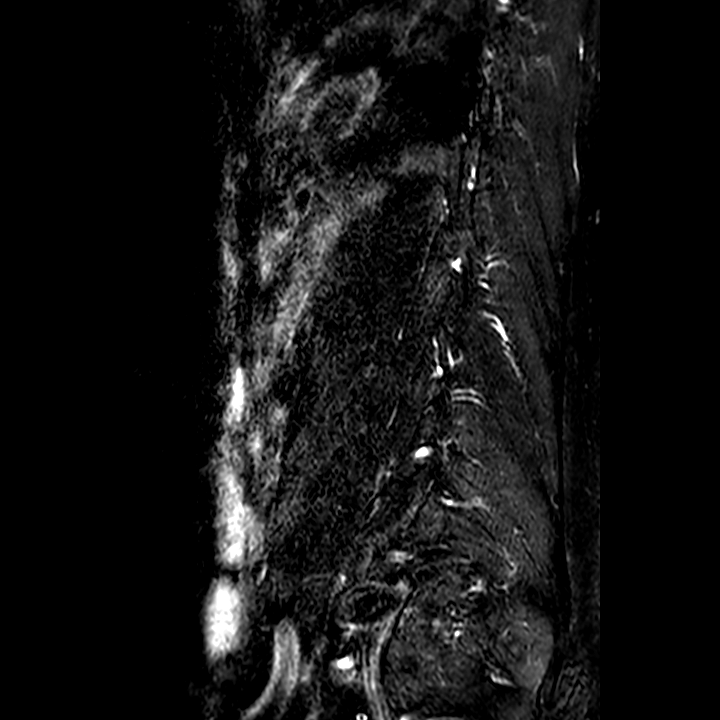

[Series 403: st2w_mdixon_tse · sagittal · 4.0mm · 0.38mm/px · 1 of 19 slices shown]
[im 1/19]
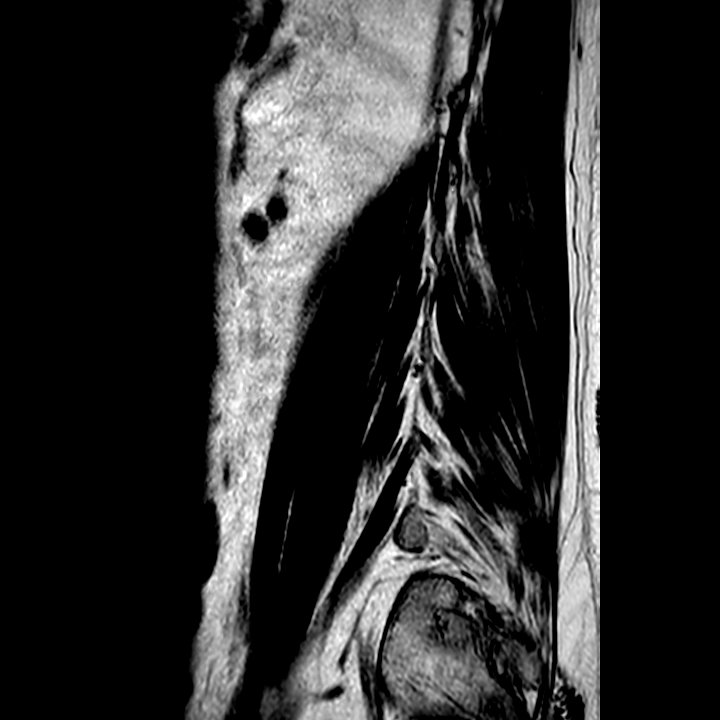

[Series 601: T2 · axial · 4.0mm · 0.30mm/px · z∈[-26,+223]mm · 3 of 30 slices shown]
[im 1/30]
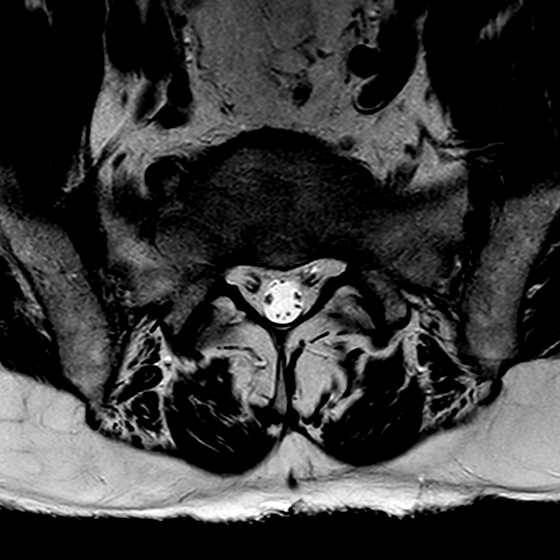
[im 15/30]
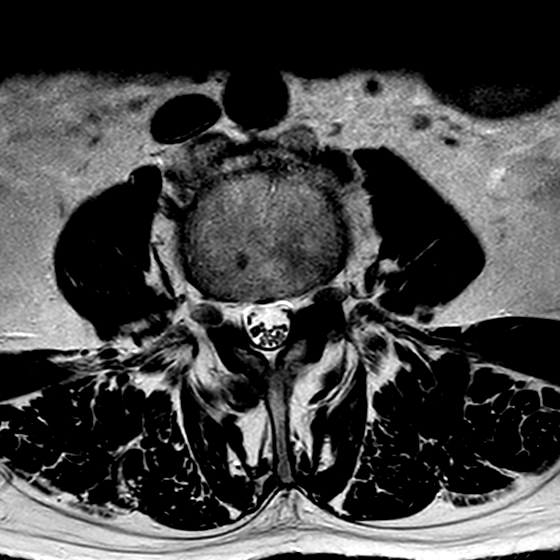
[im 30/30]
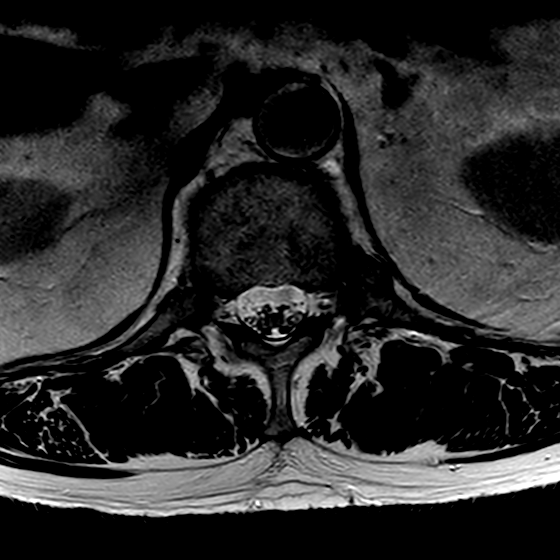

[Series 701: T1 · axial · 4.0mm · 0.39mm/px · z∈[-4,+216]mm · 6 of 50 slices shown (2 of 2)]
[im 1/50]
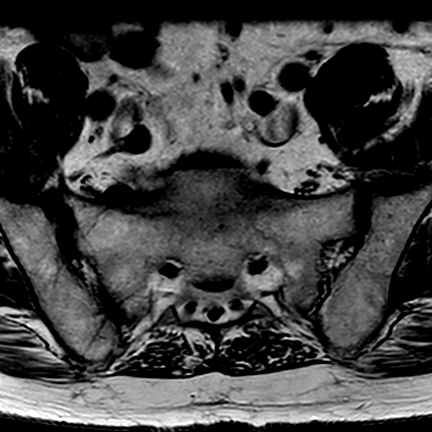
[im 10/50]
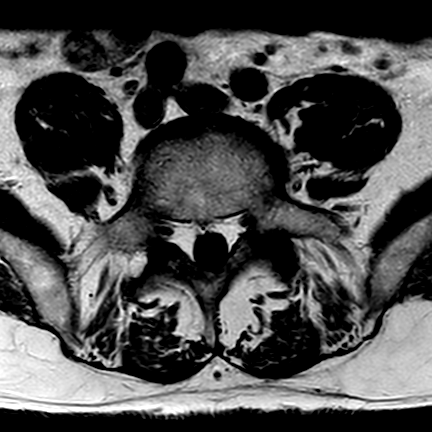
[im 20/50]
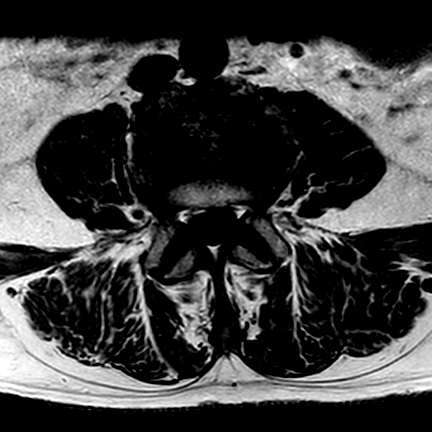
[im 30/50]
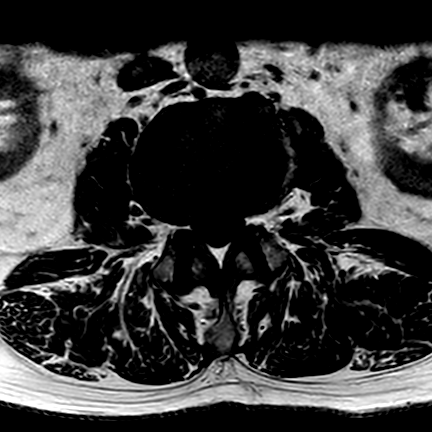
[im 40/50]
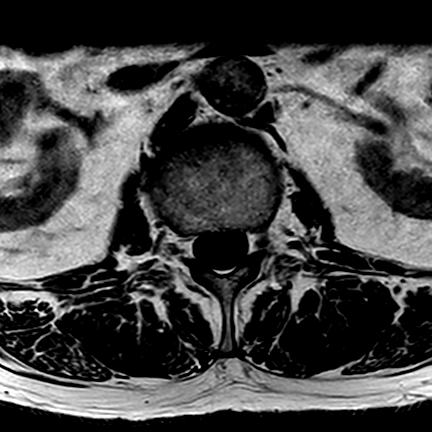
[im 50/50]
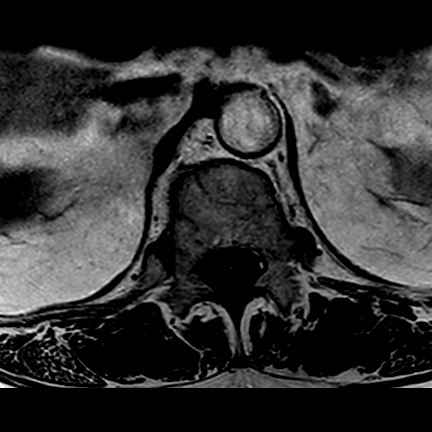

[17 of 48 positions shown; findings below may reference images not displayed]

FINDINGS: Lumbar vertebral heights are intact. No significant loss of disc 
height. No evidence for malignancy. 1 mm retrolisthesis L3-4. Prominent anterior 
osteophyte at L3-4. Conus terminates opposite T12. 
At L5-S1 there is mild disc bulge. No canal stenosis. Moderate to marked facet 
degenerative change. Foramina are open. 
At L4-5 there is a midline disc bulge mildly indenting the ventral thecal sac. 
Moderate facet change with ligamentous thickening contributes to mild canal 
stenosis. Foramina are open. 
At L3-4 there is mild canal stenosis. Encroachment on the upper L4 lateral 
recesses, axial image 12. Mild left foraminal stenosis, right foramen open. 
Moderate facet change. 
At L2-3 the canal and foramina are open. Moderate facet change. 
At L1-2 there is mild disc bulge with annular tear. The canal and foramina are 
open. 
At T12-L1 there is mild disc bulge without significant stenosis.
IMPRESSION: Degenerative changes. Mild canal stenosis at L3-4, with encroachment on the 
upper L4 lateral recesses, potentially worsen with weightbearing. 1 mm 
retrolisthesis at this level. Mild Modic type I change at L3-4. 
Mild canal stenosis L4-5.

## 2023-03-01 IMAGING — MR MRI RIGHT HIP WITHOUT CONTRAST
4 of 6 series · 12 of 40 positions shown · IV contrast (gadolinium)
Comparison: 01/29/2023 [facility]

________________________________________________________________________________________________ 
MRI LEFT HIP WITHOUT CONTRAST, MRI RIGHT HIP WITHOUT CONTRAST, 03/01/2023 [DATE]: 
CLINICAL INDICATION: Pain in unspecified hip.
TECHNIQUE: Multiplanar, multiecho position MR images of the pelvis and bilateral 
hips were performed without intravenous gadolinium enhancement. Small 
field-of-view imaging was performed of the hips.

[Series 201: survey · axial · 15.0mm · 1.76mm/px · z∈[+20,+263]mm · 3 of 14 slices shown]
[im 1/14]
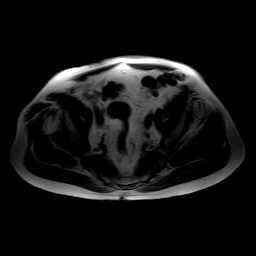
[im 7/14]
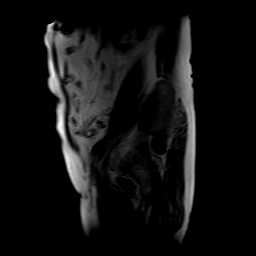
[im 14/14]
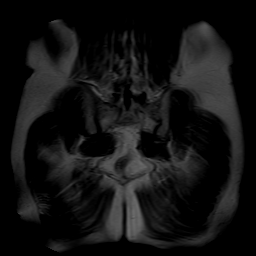

[Series 501: t1_cor-pelvis · coronal · 5.0mm · 0.39mm/px · 3 of 30 slices shown]
[im 5/30]
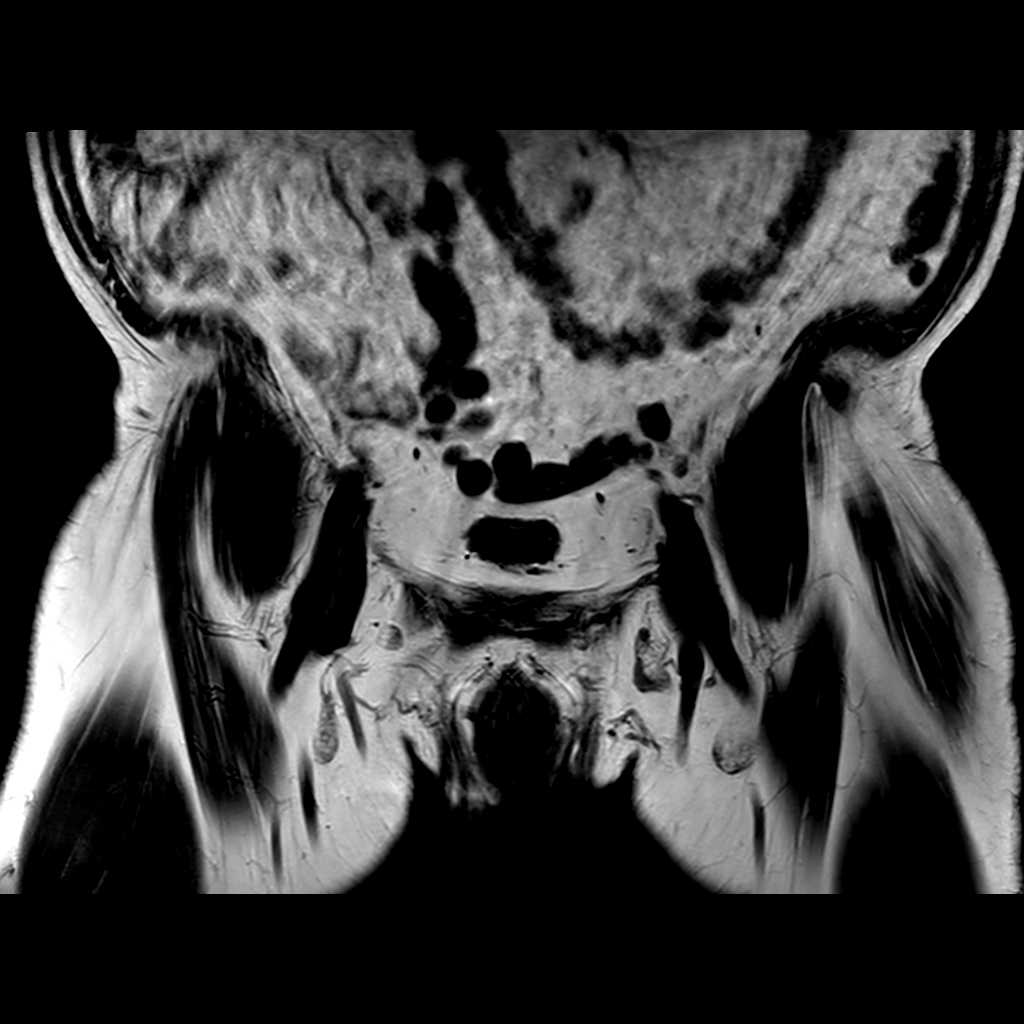
[im 15/30]
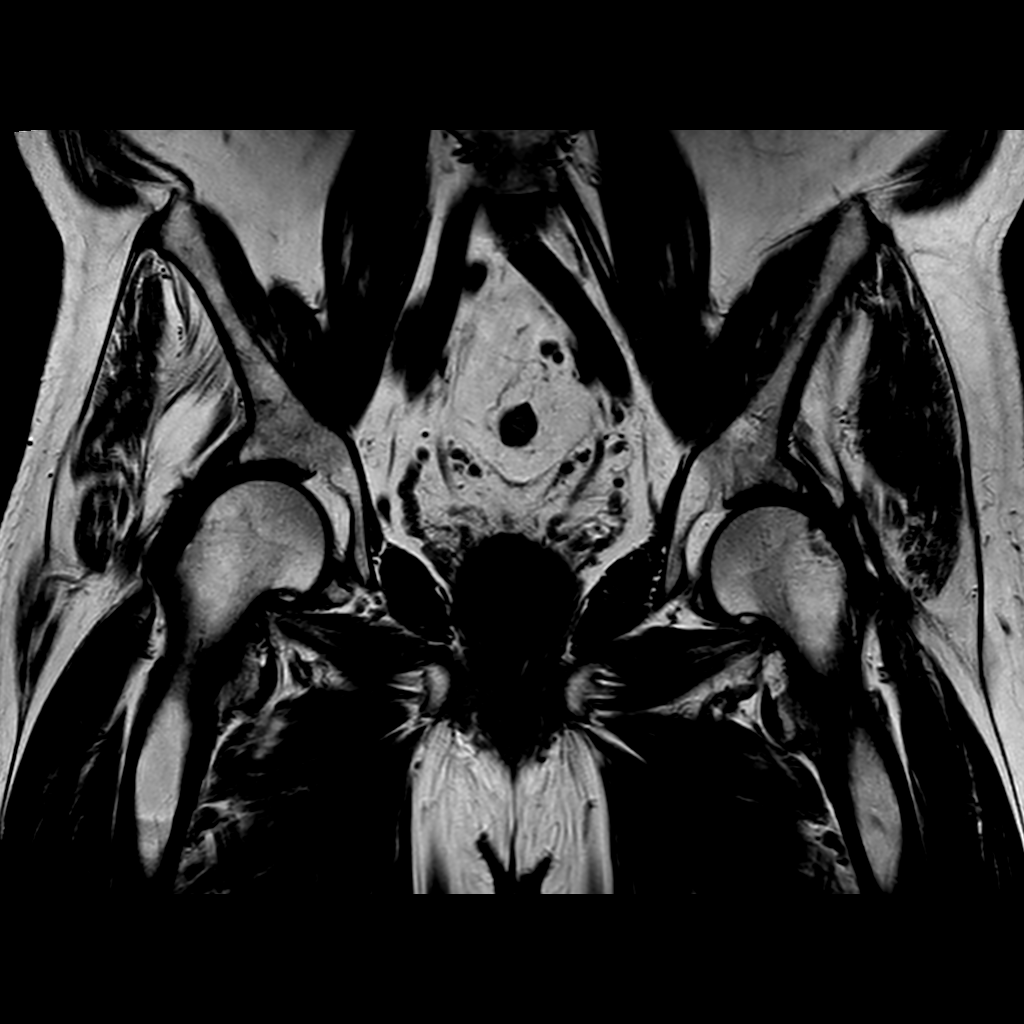
[im 25/30]
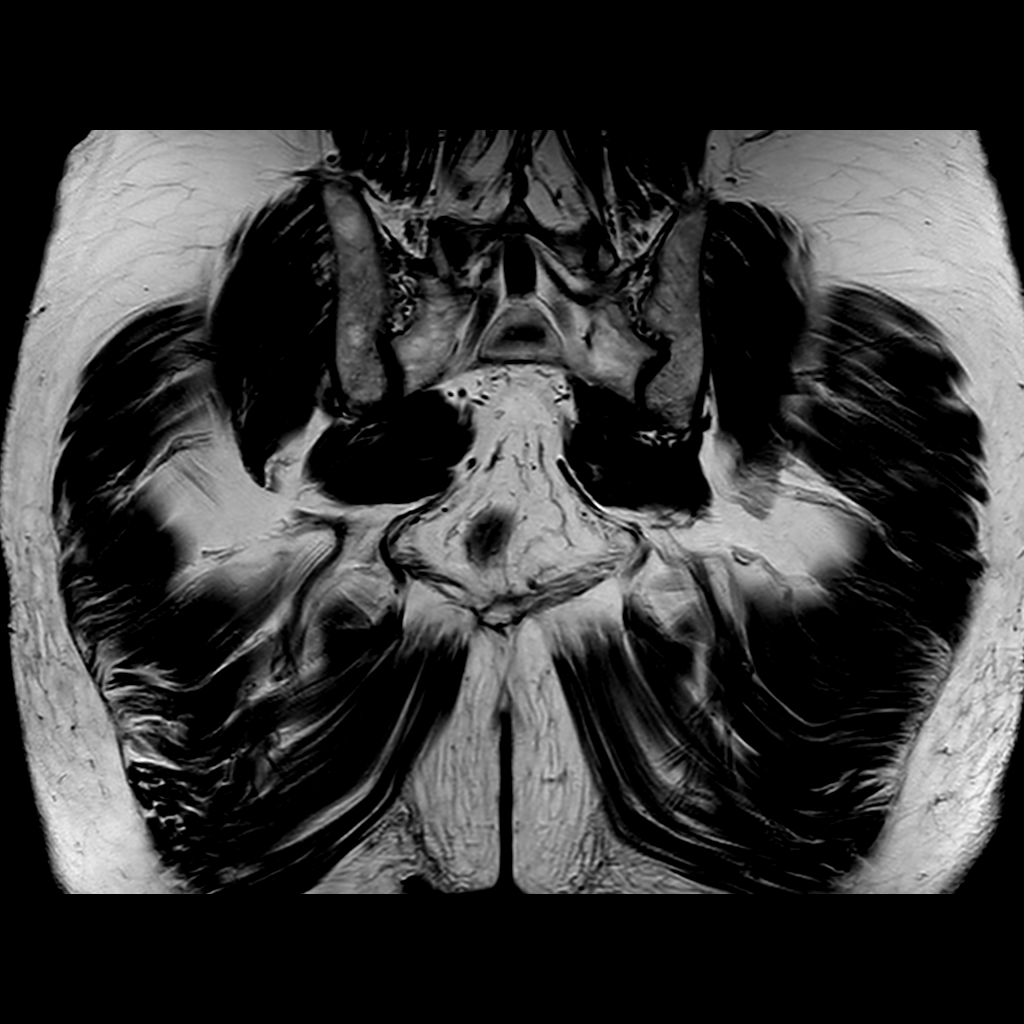

[Series 601: stir_(person_name) · axial · 5.0mm · 0.72mm/px · z∈[-97,+83]mm · 3 of 40 slices shown]
[im 5/40]
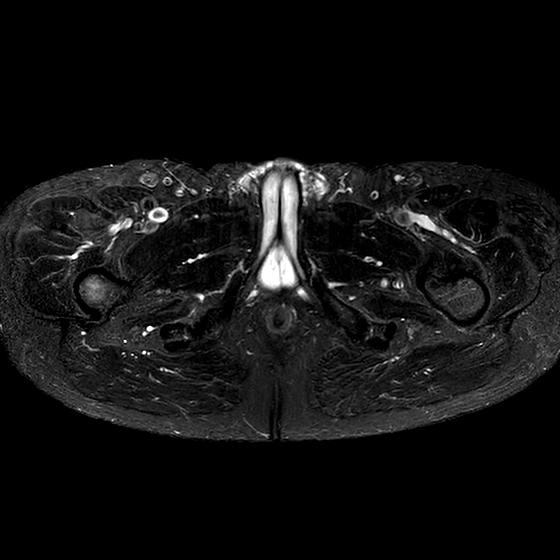
[im 22/40]
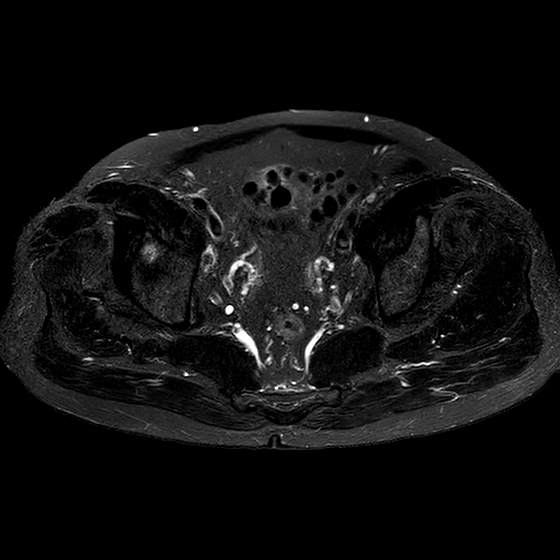
[im 35/40]
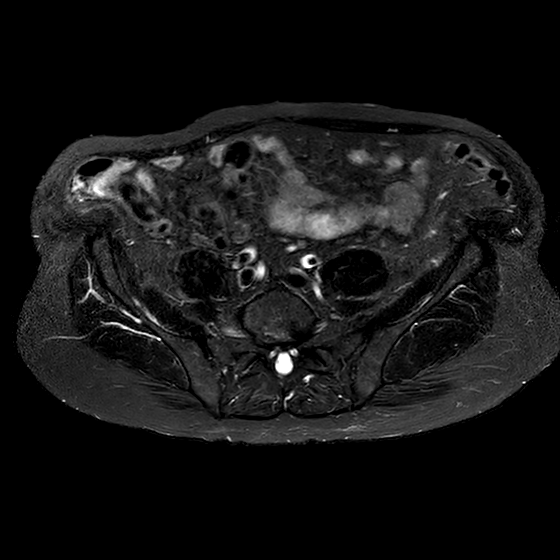

[Series 1001: pd_fs_sag fh rt · sagittal · 4.0mm · 0.55mm/px · 3 of 32 slices shown]
[im 5/32]
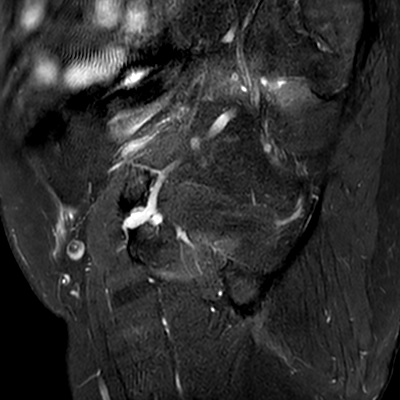
[im 18/32]
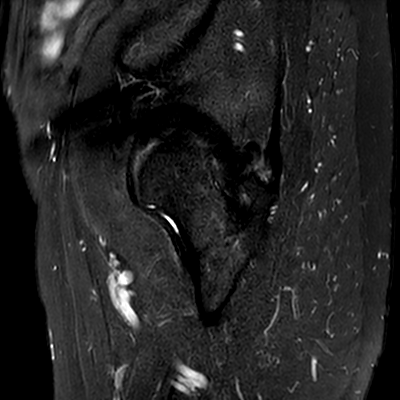
[im 27/32]
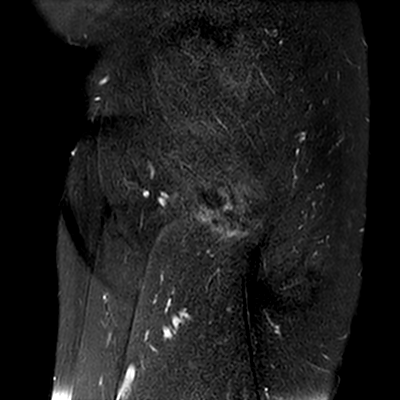

[12 of 40 positions shown; findings below may reference images not displayed]

FINDINGS: HIPS: Mild degenerative change of the hips with partial thickness 
chondromalacia, 1.0 cm focus of right superior acetabular subcortical cystic 
change, bilateral tiny calcaneal enthesophytes and 0.6 cm left anterior femoral 
neck synovial herniation pit. Degenerative labral tears and 1.1 cm right 
anterior paralabral cyst. No hip joint effusion. Both femoral heads maintain a 
spherical configuration without evidence of avascular necrosis or subarticular 
collapse. No abnormal morphology of the proximal femurs or acetabulum to 
predispose to impingement. Bilateral femoral greater trochanteric enthesopathy. 
PELVIC BONES: Normal marrow signal intensity. No fracture, contusion or marrow 
replacing lesion.  
SI JOINTS: Mild degenerative change, greater on the right. 
PUBIC SYMPHYSIS: Preserved. 
SPINE: Multilevel degenerative change of the spine. 
SOFT TISSUES: Partial thickness tears and tendinosis of the right gluteus 
medius/minimus tendons with small amount of peritendinous/peritrochanteric 
fluid. Mild tendinosis of the left distal gluteus minimus tendons. No 
trochanteric bursitis. The origins of the hamstrings are intact. The rectus 
abdominis-adductor aponeurotic complexes are intact. Focal fatty atrophy of the 
right proximal rectus femoris muscle, consistent with remote strain. No mass, 
free fluid or adenopathy. Colonic diverticulosis. Bladder is decompressed. Mild 
prostatomegaly (4.8 x 3.6 x 4.0 cm).
IMPRESSION: 1.  Mild degenerative change of the hips, labral tears and 1.1 cm right anterior 
paralabral cyst.     
2.  Bilateral femoral greater trochanteric enthesopathy, right gluteal partial 
thickness tendon tears/tendinosis and left gluteal tendinosis.  
3.  Focal fatty atrophy of the right proximal rectus femoris muscle, consistent 
with remote strain.  
4.  Degenerative change of the SI joints and spine. 
5.  Colonic diverticulosis.  
6.  Mild prostatomegaly.

## 2023-03-01 IMAGING — MR MRI LEFT HIP WITHOUT CONTRAST
3 of 6 series · 8 of 40 positions shown · IV contrast (gadolinium)
Comparison: 01/29/2023 [facility]

________________________________________________________________________________________________ 
MRI LEFT HIP WITHOUT CONTRAST, MRI RIGHT HIP WITHOUT CONTRAST, 03/01/2023 [DATE]: 
CLINICAL INDICATION: Pain in unspecified hip.
TECHNIQUE: Multiplanar, multiecho position MR images of the pelvis and bilateral 
hips were performed without intravenous gadolinium enhancement. Small 
field-of-view imaging was performed of the hips.

[Series 201: survey · axial · 15.0mm · 1.76mm/px · z∈[+20,+263]mm · 3 of 14 slices shown]
[im 1/14]
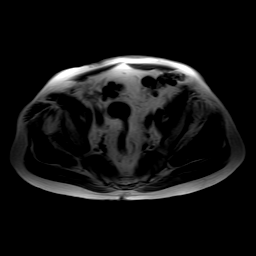
[im 7/14]
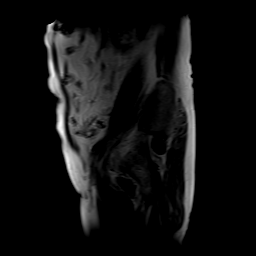
[im 14/14]
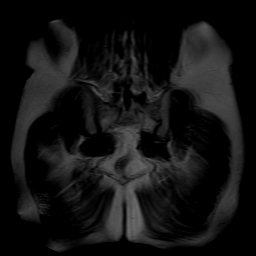

[Series 301: stir_cor-pelvis · coronal · 5.0mm · 0.58mm/px · 3 of 30 slices shown]
[im 5/30]
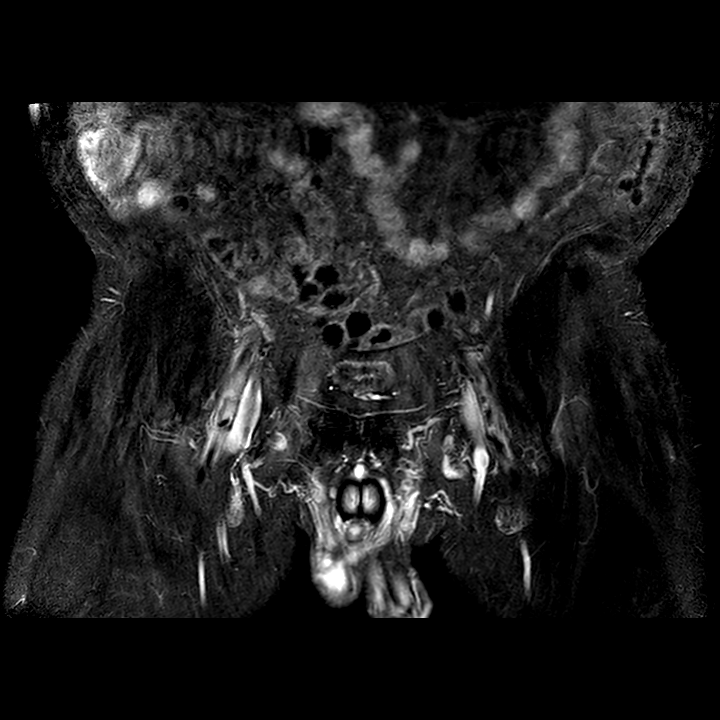
[im 15/30]
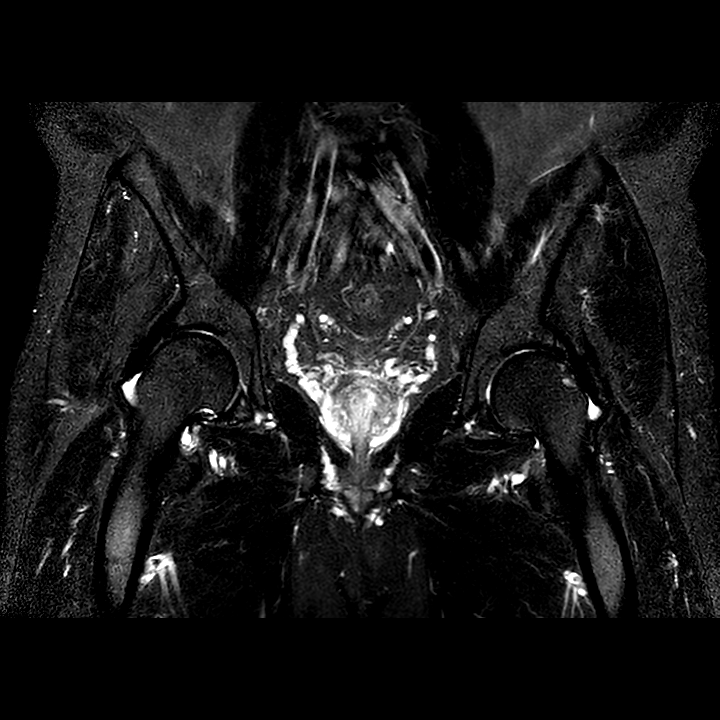
[im 25/30]
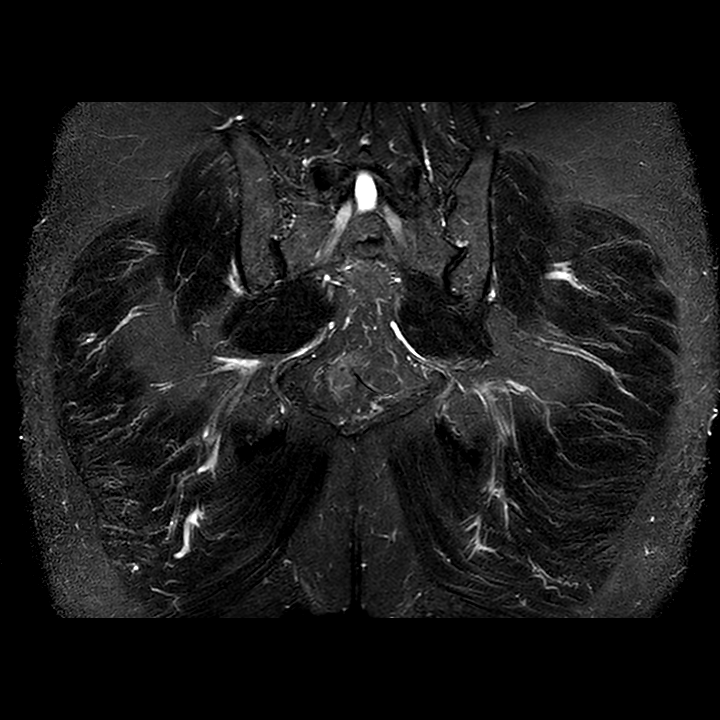

[Series 401: t1_(person_name) · axial · 5.0mm · 0.38mm/px · z∈[-97,+5]mm · 2 of 40 slices shown]
[im 5/40]
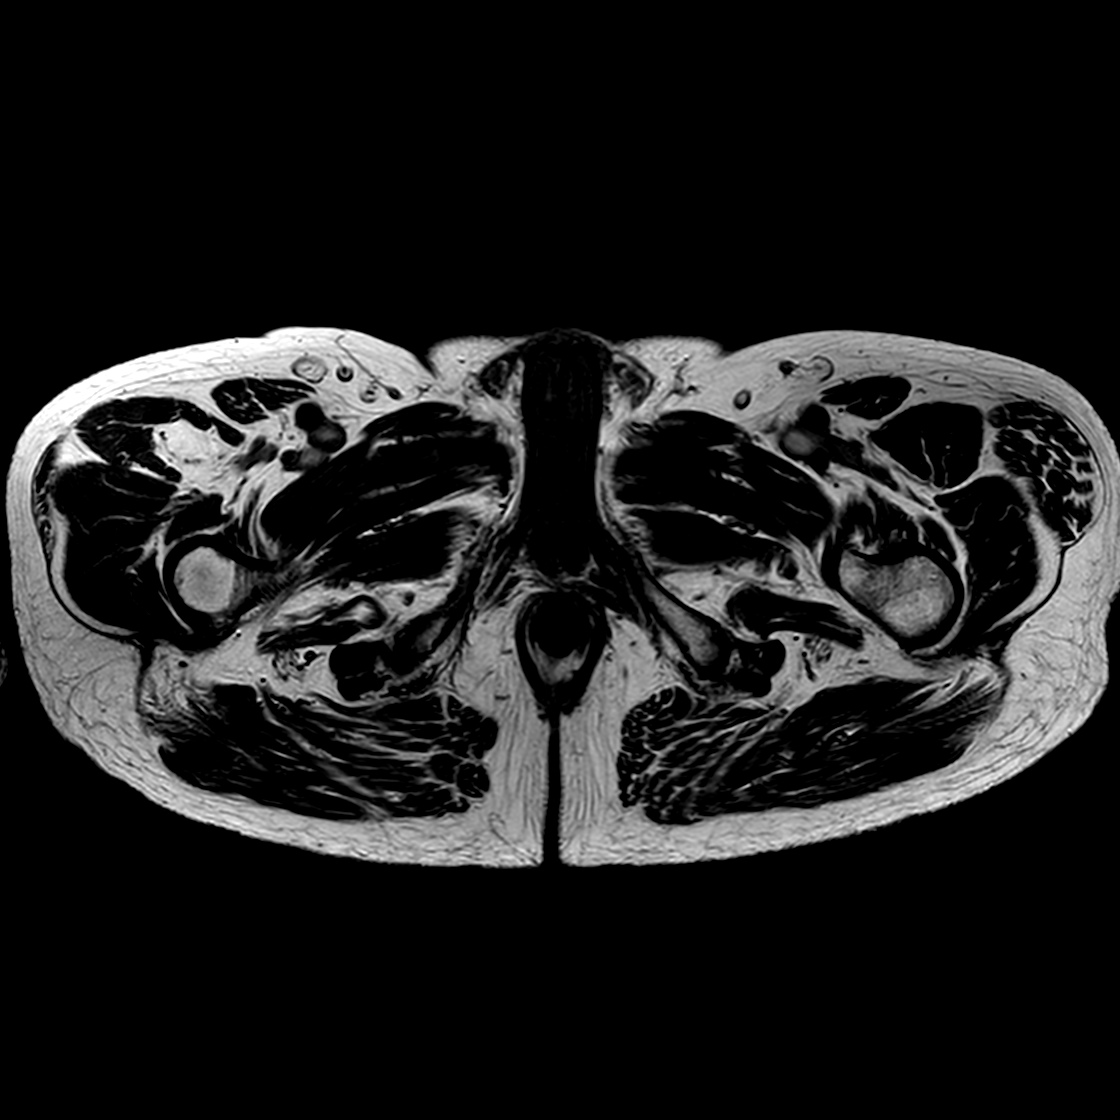
[im 22/40]
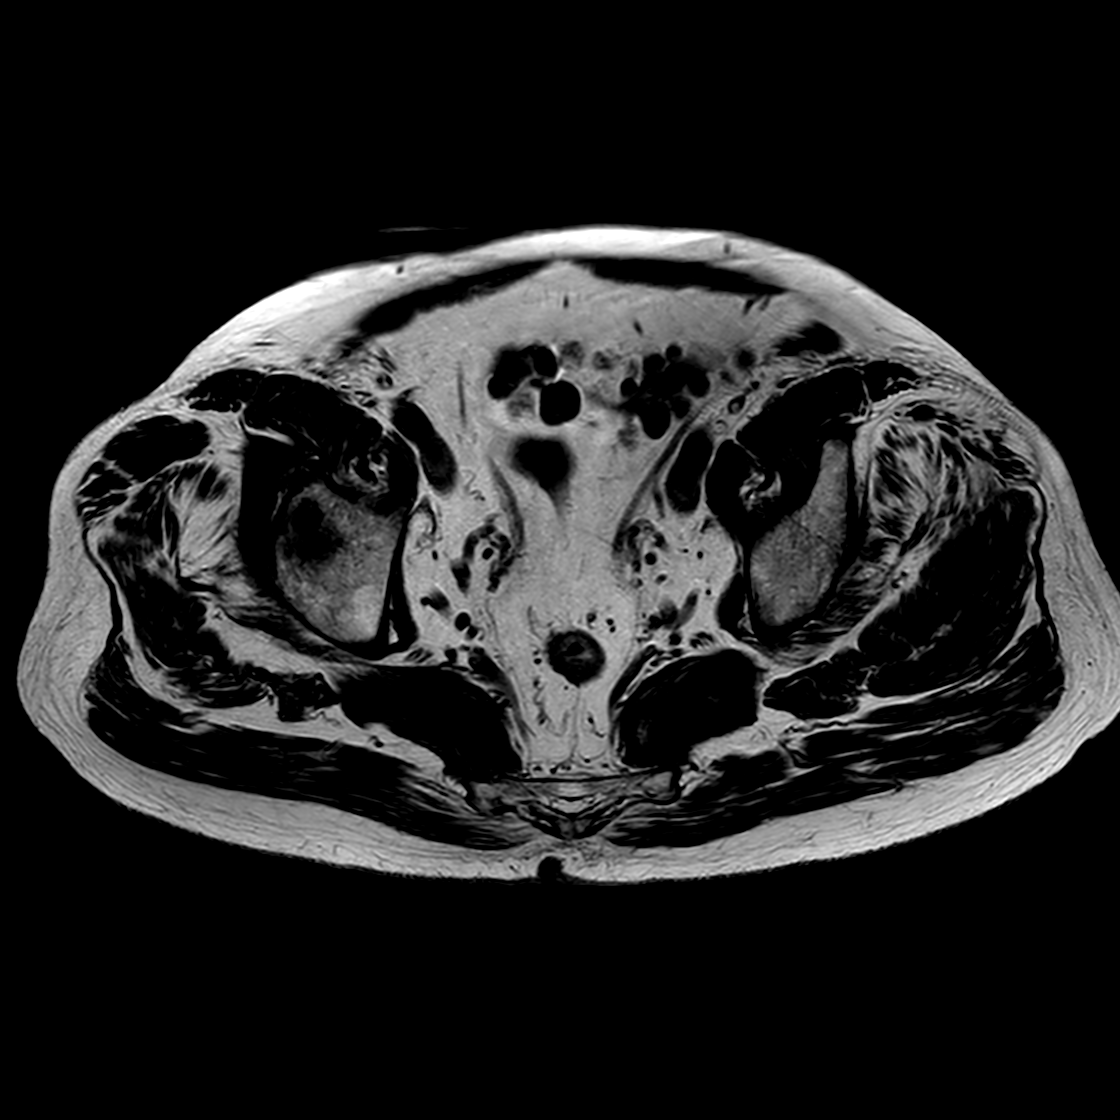

[8 of 40 positions shown; findings below may reference images not displayed]

FINDINGS: HIPS: Mild degenerative change of the hips with partial thickness 
chondromalacia, 1.0 cm focus of right superior acetabular subcortical cystic 
change, bilateral tiny calcaneal enthesophytes and 0.6 cm left anterior femoral 
neck synovial herniation pit. Degenerative labral tears and 1.1 cm right 
anterior paralabral cyst. No hip joint effusion. Both femoral heads maintain a 
spherical configuration without evidence of avascular necrosis or subarticular 
collapse. No abnormal morphology of the proximal femurs or acetabulum to 
predispose to impingement. Bilateral femoral greater trochanteric enthesopathy. 
PELVIC BONES: Normal marrow signal intensity. No fracture, contusion or marrow 
replacing lesion.  
SI JOINTS: Mild degenerative change, greater on the right. 
PUBIC SYMPHYSIS: Preserved. 
SPINE: Multilevel degenerative change of the spine. 
SOFT TISSUES: Partial thickness tears and tendinosis of the right gluteus 
medius/minimus tendons with small amount of peritendinous/peritrochanteric 
fluid. Mild tendinosis of the left distal gluteus minimus tendons. No 
trochanteric bursitis. The origins of the hamstrings are intact. The rectus 
abdominis-adductor aponeurotic complexes are intact. Focal fatty atrophy of the 
right proximal rectus femoris muscle, consistent with remote strain. No mass, 
free fluid or adenopathy. Colonic diverticulosis. Bladder is decompressed. Mild 
prostatomegaly (4.8 x 3.6 x 4.0 cm).
IMPRESSION: 1.  Mild degenerative change of the hips, labral tears and 1.1 cm right anterior 
paralabral cyst.     
2.  Bilateral femoral greater trochanteric enthesopathy, right gluteal partial 
thickness tendon tears/tendinosis and left gluteal tendinosis.  
3.  Focal fatty atrophy of the right proximal rectus femoris muscle, consistent 
with remote strain.  
4.  Degenerative change of the SI joints and spine. 
5.  Colonic diverticulosis.  
6.  Mild prostatomegaly.

## 8387-06-07 DEATH — deceased
